# Patient Record
Sex: Male | Born: 1935 | Race: White | Hispanic: No | Smoking: Former smoker
Health system: Southern US, Community
[De-identification: ages and names within clinical notes are randomized; demographics above are authoritative.]

## PROBLEM LIST (undated history)

## (undated) DIAGNOSIS — R195 Other fecal abnormalities: Secondary | ICD-10-CM

## (undated) DIAGNOSIS — K219 Gastro-esophageal reflux disease without esophagitis: Secondary | ICD-10-CM

## (undated) DIAGNOSIS — Z85038 Personal history of other malignant neoplasm of large intestine: Secondary | ICD-10-CM

## (undated) DIAGNOSIS — I6200 Nontraumatic subdural hemorrhage, unspecified: Secondary | ICD-10-CM

## (undated) DIAGNOSIS — J9819 Other pulmonary collapse: Secondary | ICD-10-CM

## (undated) DIAGNOSIS — G4700 Insomnia, unspecified: Secondary | ICD-10-CM

## (undated) DIAGNOSIS — D490 Neoplasm of unspecified behavior of digestive system: Secondary | ICD-10-CM

## (undated) DIAGNOSIS — R071 Chest pain on breathing: Secondary | ICD-10-CM

## (undated) DIAGNOSIS — J159 Unspecified bacterial pneumonia: Secondary | ICD-10-CM

## (undated) DIAGNOSIS — K5520 Angiodysplasia of colon without hemorrhage: Secondary | ICD-10-CM

## (undated) DIAGNOSIS — I719 Aortic aneurysm of unspecified site, without rupture: Secondary | ICD-10-CM

## (undated) DIAGNOSIS — R413 Other amnesia: Secondary | ICD-10-CM

## (undated) DIAGNOSIS — J449 Chronic obstructive pulmonary disease, unspecified: Secondary | ICD-10-CM

## (undated) DIAGNOSIS — I10 Essential (primary) hypertension: Secondary | ICD-10-CM

## (undated) DIAGNOSIS — D509 Iron deficiency anemia, unspecified: Secondary | ICD-10-CM

## (undated) DIAGNOSIS — F329 Major depressive disorder, single episode, unspecified: Secondary | ICD-10-CM

## (undated) HISTORY — PX: PARTIAL COLECTOMY: SHX5273

## (undated) HISTORY — DX: Other pulmonary collapse: J98.19

## (undated) HISTORY — DX: Major depressive disorder, single episode, unspecified: F32.9

## (undated) HISTORY — DX: Unspecified bacterial pneumonia: J15.9

## (undated) HISTORY — DX: Personal history of other malignant neoplasm of large intestine: Z85.038

## (undated) HISTORY — DX: Iron deficiency anemia, unspecified: D50.9

## (undated) HISTORY — DX: Neoplasm of unspecified behavior of digestive system: D49.0

## (undated) HISTORY — PX: CHOLECYSTECTOMY: SHX55

## (undated) HISTORY — DX: Chest pain on breathing: R07.1

## (undated) HISTORY — PX: AORTIC VALVE REPLACEMENT: SHX41

## (undated) HISTORY — DX: Other fecal abnormalities: R19.5

## (undated) HISTORY — DX: Essential (primary) hypertension: I10

## (undated) HISTORY — DX: Chronic obstructive pulmonary disease, unspecified: J44.9

## (undated) HISTORY — DX: Nontraumatic subdural hemorrhage, unspecified: I62.00

## (undated) HISTORY — DX: Aortic aneurysm of unspecified site, without rupture: I71.9

## (undated) HISTORY — DX: Angiodysplasia of colon without hemorrhage: K55.20

## (undated) HISTORY — DX: Insomnia, unspecified: G47.00

## (undated) HISTORY — DX: Gastro-esophageal reflux disease without esophagitis: K21.9

## (undated) HISTORY — DX: Other amnesia: R41.3

---

## 1999-10-08 ENCOUNTER — Emergency Department (HOSPITAL_COMMUNITY): Admission: EM | Admit: 1999-10-08 | Discharge: 1999-10-08 | Payer: Self-pay | Admitting: Emergency Medicine

## 1999-10-09 ENCOUNTER — Encounter: Payer: Self-pay | Admitting: Emergency Medicine

## 2001-06-18 ENCOUNTER — Emergency Department (HOSPITAL_COMMUNITY): Admission: EM | Admit: 2001-06-18 | Discharge: 2001-06-18 | Payer: Self-pay | Admitting: Emergency Medicine

## 2001-06-18 ENCOUNTER — Encounter: Payer: Self-pay | Admitting: Emergency Medicine

## 2001-10-26 ENCOUNTER — Encounter: Payer: Self-pay | Admitting: Family Medicine

## 2001-10-26 ENCOUNTER — Ambulatory Visit (HOSPITAL_COMMUNITY): Admission: RE | Admit: 2001-10-26 | Discharge: 2001-10-26 | Payer: Self-pay | Admitting: Family Medicine

## 2005-04-16 ENCOUNTER — Inpatient Hospital Stay (HOSPITAL_COMMUNITY): Admission: EM | Admit: 2005-04-16 | Discharge: 2005-05-03 | Payer: Self-pay | Admitting: Emergency Medicine

## 2005-04-17 ENCOUNTER — Encounter (INDEPENDENT_AMBULATORY_CARE_PROVIDER_SITE_OTHER): Payer: Self-pay | Admitting: *Deleted

## 2005-04-21 ENCOUNTER — Ambulatory Visit: Payer: Self-pay | Admitting: Internal Medicine

## 2005-04-22 ENCOUNTER — Encounter (INDEPENDENT_AMBULATORY_CARE_PROVIDER_SITE_OTHER): Payer: Self-pay | Admitting: *Deleted

## 2005-04-22 ENCOUNTER — Encounter: Payer: Self-pay | Admitting: Internal Medicine

## 2005-04-25 ENCOUNTER — Encounter (INDEPENDENT_AMBULATORY_CARE_PROVIDER_SITE_OTHER): Payer: Self-pay | Admitting: Specialist

## 2005-04-25 ENCOUNTER — Encounter: Payer: Self-pay | Admitting: Internal Medicine

## 2005-05-03 ENCOUNTER — Encounter: Payer: Self-pay | Admitting: Internal Medicine

## 2005-05-05 ENCOUNTER — Ambulatory Visit: Payer: Self-pay | Admitting: Hematology & Oncology

## 2005-05-20 ENCOUNTER — Encounter: Admission: RE | Admit: 2005-05-20 | Discharge: 2005-05-20 | Payer: Self-pay | Admitting: Dentistry

## 2005-05-20 ENCOUNTER — Ambulatory Visit: Payer: Self-pay | Admitting: Dentistry

## 2005-06-26 ENCOUNTER — Ambulatory Visit (HOSPITAL_COMMUNITY): Admission: RE | Admit: 2005-06-26 | Discharge: 2005-06-26 | Payer: Self-pay | Admitting: Dentistry

## 2005-06-26 ENCOUNTER — Ambulatory Visit: Payer: Self-pay | Admitting: Dentistry

## 2005-07-07 ENCOUNTER — Ambulatory Visit (HOSPITAL_COMMUNITY): Admission: RE | Admit: 2005-07-07 | Discharge: 2005-07-07 | Payer: Self-pay | Admitting: Hematology & Oncology

## 2005-07-22 ENCOUNTER — Encounter: Admission: RE | Admit: 2005-07-22 | Discharge: 2005-07-22 | Payer: Self-pay | Admitting: Surgery

## 2005-07-25 ENCOUNTER — Ambulatory Visit: Payer: Self-pay | Admitting: Hematology & Oncology

## 2005-08-04 ENCOUNTER — Encounter (INDEPENDENT_AMBULATORY_CARE_PROVIDER_SITE_OTHER): Payer: Self-pay | Admitting: Specialist

## 2005-08-04 ENCOUNTER — Inpatient Hospital Stay (HOSPITAL_COMMUNITY): Admission: RE | Admit: 2005-08-04 | Discharge: 2005-08-13 | Payer: Self-pay | Admitting: Surgery

## 2005-08-20 ENCOUNTER — Inpatient Hospital Stay (HOSPITAL_COMMUNITY): Admission: EM | Admit: 2005-08-20 | Discharge: 2005-08-23 | Payer: Self-pay | Admitting: Emergency Medicine

## 2005-08-22 ENCOUNTER — Encounter (INDEPENDENT_AMBULATORY_CARE_PROVIDER_SITE_OTHER): Payer: Self-pay | Admitting: *Deleted

## 2005-09-16 ENCOUNTER — Encounter: Admission: RE | Admit: 2005-09-16 | Discharge: 2005-09-16 | Payer: Self-pay | Admitting: Surgery

## 2005-09-26 ENCOUNTER — Ambulatory Visit (HOSPITAL_COMMUNITY): Admission: RE | Admit: 2005-09-26 | Discharge: 2005-09-26 | Payer: Self-pay | Admitting: Hematology & Oncology

## 2005-10-03 ENCOUNTER — Ambulatory Visit: Payer: Self-pay | Admitting: Hematology & Oncology

## 2005-10-06 ENCOUNTER — Encounter: Payer: Self-pay | Admitting: Internal Medicine

## 2005-10-06 LAB — CBC WITH DIFFERENTIAL/PLATELET
Basophils Absolute: 0 10*3/uL (ref 0.0–0.1)
Eosinophils Absolute: 0.1 10*3/uL (ref 0.0–0.5)
HCT: 35.7 % — ABNORMAL LOW (ref 38.7–49.9)
HGB: 11.5 g/dL — ABNORMAL LOW (ref 13.0–17.1)
LYMPH%: 23.7 % (ref 14.0–48.0)
MCV: 83 fL (ref 81.6–98.0)
MONO#: 0.4 10*3/uL (ref 0.1–0.9)
MONO%: 6.3 % (ref 0.0–13.0)
NEUT#: 4.5 10*3/uL (ref 1.5–6.5)
Platelets: 273 10*3/uL (ref 145–400)
WBC: 6.7 10*3/uL (ref 4.0–10.0)

## 2005-10-06 LAB — COMPREHENSIVE METABOLIC PANEL
Albumin: 4 g/dL (ref 3.5–5.2)
Alkaline Phosphatase: 103 U/L (ref 39–117)
BUN: 14 mg/dL (ref 6–23)
CO2: 26 mEq/L (ref 19–32)
Glucose, Bld: 109 mg/dL — ABNORMAL HIGH (ref 70–99)
Potassium: 4.3 mEq/L (ref 3.5–5.3)
Total Bilirubin: 0.3 mg/dL (ref 0.3–1.2)
Total Protein: 6.6 g/dL (ref 6.0–8.3)

## 2005-10-06 LAB — CEA: CEA: 1 ng/mL (ref 0.0–5.0)

## 2005-12-29 ENCOUNTER — Ambulatory Visit (HOSPITAL_COMMUNITY): Admission: RE | Admit: 2005-12-29 | Discharge: 2005-12-29 | Payer: Self-pay | Admitting: Hematology & Oncology

## 2005-12-30 ENCOUNTER — Ambulatory Visit: Payer: Self-pay | Admitting: Hematology & Oncology

## 2006-01-05 LAB — CBC WITH DIFFERENTIAL/PLATELET
BASO%: 0.3 % (ref 0.0–2.0)
EOS%: 1.8 % (ref 0.0–7.0)
MCH: 24.5 pg — ABNORMAL LOW (ref 28.0–33.4)
MCHC: 32.5 g/dL (ref 32.0–35.9)
MONO#: 0.4 10*3/uL (ref 0.1–0.9)
RBC: 3.83 10*6/uL — ABNORMAL LOW (ref 4.20–5.71)
RDW: 16 % — ABNORMAL HIGH (ref 11.2–14.6)
WBC: 5.7 10*3/uL (ref 4.0–10.0)
lymph#: 1.5 10*3/uL (ref 0.9–3.3)

## 2006-01-07 LAB — COMPREHENSIVE METABOLIC PANEL
ALT: 14 U/L (ref 0–40)
AST: 15 U/L (ref 0–37)
CO2: 24 mEq/L (ref 19–32)
Calcium: 8.9 mg/dL (ref 8.4–10.5)
Chloride: 109 mEq/L (ref 96–112)
Creatinine, Ser: 1.2 mg/dL (ref 0.40–1.50)
Sodium: 141 mEq/L (ref 135–145)
Total Bilirubin: 0.3 mg/dL (ref 0.3–1.2)
Total Protein: 6.7 g/dL (ref 6.0–8.3)

## 2006-01-29 ENCOUNTER — Ambulatory Visit: Payer: Self-pay | Admitting: Internal Medicine

## 2006-02-13 ENCOUNTER — Ambulatory Visit: Payer: Self-pay | Admitting: Internal Medicine

## 2006-02-27 ENCOUNTER — Ambulatory Visit: Payer: Self-pay | Admitting: Hematology & Oncology

## 2006-03-02 LAB — CBC & DIFF AND RETIC
Basophils Absolute: 0 10*3/uL (ref 0.0–0.1)
EOS%: 1.6 % (ref 0.0–7.0)
Eosinophils Absolute: 0.1 10*3/uL (ref 0.0–0.5)
HCT: 35.3 % — ABNORMAL LOW (ref 38.7–49.9)
HGB: 11.7 g/dL — ABNORMAL LOW (ref 13.0–17.1)
IRF: 0.27 (ref 0.070–0.380)
MCH: 28.4 pg (ref 28.0–33.4)
MONO#: 0.4 10*3/uL (ref 0.1–0.9)
NEUT#: 4.1 10*3/uL (ref 1.5–6.5)
NEUT%: 66 % (ref 40.0–75.0)
RETIC #: 42 10*3/uL (ref 31.8–103.9)
lymph#: 1.6 10*3/uL (ref 0.9–3.3)

## 2006-03-02 LAB — CHCC SMEAR

## 2006-03-04 LAB — FERRITIN: Ferritin: 121 ng/mL (ref 22–322)

## 2006-03-04 LAB — TRANSFERRIN RECEPTOR, SOLUABLE: Transferrin Receptor, Soluble: 6.9 mg/L — ABNORMAL HIGH (ref 2.2–5.0)

## 2006-05-25 ENCOUNTER — Ambulatory Visit (HOSPITAL_COMMUNITY): Admission: RE | Admit: 2006-05-25 | Discharge: 2006-05-25 | Payer: Self-pay | Admitting: Hematology & Oncology

## 2006-05-28 ENCOUNTER — Ambulatory Visit: Payer: Self-pay | Admitting: Hematology & Oncology

## 2006-06-01 LAB — CBC WITH DIFFERENTIAL/PLATELET
BASO%: 0.3 % (ref 0.0–2.0)
Basophils Absolute: 0 10*3/uL (ref 0.0–0.1)
HCT: 36.5 % — ABNORMAL LOW (ref 38.7–49.9)
HGB: 12.2 g/dL — ABNORMAL LOW (ref 13.0–17.1)
MCHC: 33.4 g/dL (ref 32.0–35.9)
MONO#: 0.4 10*3/uL (ref 0.1–0.9)
NEUT#: 4.8 10*3/uL (ref 1.5–6.5)
NEUT%: 68.3 % (ref 40.0–75.0)
WBC: 7 10*3/uL (ref 4.0–10.0)
lymph#: 1.7 10*3/uL (ref 0.9–3.3)

## 2006-06-01 LAB — COMPREHENSIVE METABOLIC PANEL
AST: 16 U/L (ref 0–37)
Albumin: 3.9 g/dL (ref 3.5–5.2)
BUN: 17 mg/dL (ref 6–23)
Calcium: 8.8 mg/dL (ref 8.4–10.5)
Chloride: 106 mEq/L (ref 96–112)
Potassium: 4.2 mEq/L (ref 3.5–5.3)

## 2006-08-20 ENCOUNTER — Emergency Department (HOSPITAL_COMMUNITY): Admission: EM | Admit: 2006-08-20 | Discharge: 2006-08-20 | Payer: Self-pay | Admitting: Emergency Medicine

## 2006-09-24 ENCOUNTER — Ambulatory Visit: Payer: Self-pay | Admitting: Hematology & Oncology

## 2006-09-28 ENCOUNTER — Ambulatory Visit (HOSPITAL_COMMUNITY): Admission: RE | Admit: 2006-09-28 | Discharge: 2006-09-28 | Payer: Self-pay | Admitting: Hematology & Oncology

## 2006-09-28 LAB — COMPREHENSIVE METABOLIC PANEL
ALT: 15 U/L (ref 0–53)
AST: 22 U/L (ref 0–37)
Chloride: 106 mEq/L (ref 96–112)
Creatinine, Ser: 1.18 mg/dL (ref 0.40–1.50)
Total Bilirubin: 0.4 mg/dL (ref 0.3–1.2)

## 2006-09-28 LAB — CBC WITH DIFFERENTIAL/PLATELET
BASO%: 0.4 % (ref 0.0–2.0)
EOS%: 1.8 % (ref 0.0–7.0)
HCT: 40 % (ref 38.7–49.9)
LYMPH%: 24.8 % (ref 14.0–48.0)
MCH: 30.5 pg (ref 28.0–33.4)
MCHC: 34.3 g/dL (ref 32.0–35.9)
NEUT%: 66.6 % (ref 40.0–75.0)
RBC: 4.49 10*6/uL (ref 4.20–5.71)
lymph#: 1.9 10*3/uL (ref 0.9–3.3)

## 2006-10-13 ENCOUNTER — Ambulatory Visit (HOSPITAL_COMMUNITY): Admission: RE | Admit: 2006-10-13 | Discharge: 2006-10-13 | Payer: Self-pay | Admitting: Hematology & Oncology

## 2007-01-21 ENCOUNTER — Ambulatory Visit: Payer: Self-pay | Admitting: Hematology & Oncology

## 2007-01-25 LAB — COMPREHENSIVE METABOLIC PANEL
AST: 16 U/L (ref 0–37)
Alkaline Phosphatase: 104 U/L (ref 39–117)
BUN: 24 mg/dL — ABNORMAL HIGH (ref 6–23)
Calcium: 9.1 mg/dL (ref 8.4–10.5)
Creatinine, Ser: 1.51 mg/dL — ABNORMAL HIGH (ref 0.40–1.50)

## 2007-01-25 LAB — CBC WITH DIFFERENTIAL/PLATELET
BASO%: 0.2 % (ref 0.0–2.0)
Basophils Absolute: 0 10*3/uL (ref 0.0–0.1)
EOS%: 5.5 % (ref 0.0–7.0)
Eosinophils Absolute: 0.4 10*3/uL (ref 0.0–0.5)
HCT: 35 % — ABNORMAL LOW (ref 38.7–49.9)
HGB: 12.2 g/dL — ABNORMAL LOW (ref 13.0–17.1)
LYMPH%: 26.7 % (ref 14.0–48.0)
MCH: 31 pg (ref 28.0–33.4)
MCHC: 34.8 g/dL (ref 32.0–35.9)
MCV: 89.2 fL (ref 81.6–98.0)
MONO#: 0.4 10*3/uL (ref 0.1–0.9)
MONO%: 6.6 % (ref 0.0–13.0)
NEUT#: 3.9 10*3/uL (ref 1.5–6.5)
NEUT%: 61 % (ref 40.0–75.0)
Platelets: 217 10*3/uL (ref 145–400)
RBC: 3.93 10*6/uL — ABNORMAL LOW (ref 4.20–5.71)
RDW: 15.6 % — ABNORMAL HIGH (ref 11.2–14.6)
WBC: 6.5 10*3/uL (ref 4.0–10.0)
lymph#: 1.7 10*3/uL (ref 0.9–3.3)

## 2007-02-01 ENCOUNTER — Ambulatory Visit (HOSPITAL_COMMUNITY): Admission: RE | Admit: 2007-02-01 | Discharge: 2007-02-01 | Payer: Self-pay | Admitting: Hematology & Oncology

## 2007-08-02 ENCOUNTER — Ambulatory Visit: Payer: Self-pay | Admitting: Hematology & Oncology

## 2007-08-04 LAB — CBC WITH DIFFERENTIAL/PLATELET
BASO%: 0.1 % (ref 0.0–2.0)
HCT: 36.8 % — ABNORMAL LOW (ref 38.7–49.9)
MCHC: 33.1 g/dL (ref 32.0–35.9)
MONO#: 0.4 10*3/uL (ref 0.1–0.9)
NEUT%: 69.6 % (ref 40.0–75.0)
WBC: 7.4 10*3/uL (ref 4.0–10.0)
lymph#: 1.5 10*3/uL (ref 0.9–3.3)

## 2007-08-05 LAB — COMPREHENSIVE METABOLIC PANEL
BUN: 32 mg/dL — ABNORMAL HIGH (ref 6–23)
CO2: 22 mEq/L (ref 19–32)
Creatinine, Ser: 1.86 mg/dL — ABNORMAL HIGH (ref 0.40–1.50)
Glucose, Bld: 121 mg/dL — ABNORMAL HIGH (ref 70–99)
Total Bilirubin: 0.4 mg/dL (ref 0.3–1.2)

## 2007-08-05 LAB — CEA: CEA: 1 ng/mL (ref 0.0–5.0)

## 2008-02-04 ENCOUNTER — Encounter: Payer: Self-pay | Admitting: Family Medicine

## 2008-02-04 ENCOUNTER — Inpatient Hospital Stay (HOSPITAL_COMMUNITY): Admission: EM | Admit: 2008-02-04 | Discharge: 2008-02-22 | Payer: Self-pay | Admitting: Emergency Medicine

## 2008-02-18 ENCOUNTER — Ambulatory Visit: Payer: Self-pay | Admitting: Physical Medicine & Rehabilitation

## 2008-02-22 ENCOUNTER — Inpatient Hospital Stay (HOSPITAL_COMMUNITY)
Admission: RE | Admit: 2008-02-22 | Discharge: 2008-03-07 | Payer: Self-pay | Admitting: Physical Medicine & Rehabilitation

## 2008-03-06 ENCOUNTER — Ambulatory Visit: Payer: Self-pay | Admitting: Physical Medicine & Rehabilitation

## 2008-03-09 ENCOUNTER — Ambulatory Visit (HOSPITAL_COMMUNITY)
Admission: RE | Admit: 2008-03-09 | Discharge: 2008-03-09 | Payer: Self-pay | Admitting: Physical Medicine & Rehabilitation

## 2008-03-29 ENCOUNTER — Inpatient Hospital Stay (HOSPITAL_COMMUNITY): Admission: RE | Admit: 2008-03-29 | Discharge: 2008-04-01 | Payer: Self-pay | Admitting: Neurosurgery

## 2008-04-04 ENCOUNTER — Ambulatory Visit: Payer: Self-pay | Admitting: Hematology & Oncology

## 2008-04-05 LAB — COMPREHENSIVE METABOLIC PANEL
ALT: 10 U/L (ref 0–53)
BUN: 16 mg/dL (ref 6–23)
CO2: 23 mEq/L (ref 19–32)
Calcium: 9 mg/dL (ref 8.4–10.5)
Chloride: 108 mEq/L (ref 96–112)
Creatinine, Ser: 1.18 mg/dL (ref 0.40–1.50)
Glucose, Bld: 97 mg/dL (ref 70–99)

## 2008-04-05 LAB — CBC WITH DIFFERENTIAL (CANCER CENTER ONLY)
BASO#: 0 10*3/uL (ref 0.0–0.2)
BASO%: 0.4 % (ref 0.0–2.0)
HCT: 29.2 % — ABNORMAL LOW (ref 38.7–49.9)
HGB: 9.9 g/dL — ABNORMAL LOW (ref 13.0–17.1)
LYMPH#: 1.4 10*3/uL (ref 0.9–3.3)
MONO#: 0.3 10*3/uL (ref 0.1–0.9)
NEUT#: 3 10*3/uL (ref 1.5–6.5)
NEUT%: 61.4 % (ref 40.0–80.0)
RDW: 13.4 % (ref 10.5–14.6)
WBC: 4.9 10*3/uL (ref 4.0–10.0)

## 2008-04-13 ENCOUNTER — Ambulatory Visit: Payer: Self-pay | Admitting: Physical Medicine & Rehabilitation

## 2008-04-13 ENCOUNTER — Encounter
Admission: RE | Admit: 2008-04-13 | Discharge: 2008-04-13 | Payer: Self-pay | Admitting: Physical Medicine & Rehabilitation

## 2008-09-21 ENCOUNTER — Ambulatory Visit: Payer: Self-pay | Admitting: Family Medicine

## 2008-09-21 DIAGNOSIS — F3289 Other specified depressive episodes: Secondary | ICD-10-CM

## 2008-09-21 DIAGNOSIS — I6200 Nontraumatic subdural hemorrhage, unspecified: Secondary | ICD-10-CM

## 2008-09-21 DIAGNOSIS — K219 Gastro-esophageal reflux disease without esophagitis: Secondary | ICD-10-CM

## 2008-09-21 DIAGNOSIS — F329 Major depressive disorder, single episode, unspecified: Secondary | ICD-10-CM

## 2008-09-21 DIAGNOSIS — Z85038 Personal history of other malignant neoplasm of large intestine: Secondary | ICD-10-CM | POA: Insufficient documentation

## 2008-09-21 HISTORY — DX: Gastro-esophageal reflux disease without esophagitis: K21.9

## 2008-09-21 HISTORY — DX: Major depressive disorder, single episode, unspecified: F32.9

## 2008-09-21 HISTORY — DX: Other specified depressive episodes: F32.89

## 2008-09-21 HISTORY — DX: Nontraumatic subdural hemorrhage, unspecified: I62.00

## 2008-09-21 HISTORY — DX: Personal history of other malignant neoplasm of large intestine: Z85.038

## 2008-10-03 ENCOUNTER — Ambulatory Visit: Payer: Self-pay | Admitting: Hematology & Oncology

## 2008-10-04 LAB — CHCC SATELLITE - SMEAR

## 2008-10-04 LAB — CBC WITH DIFFERENTIAL (CANCER CENTER ONLY)
BASO%: 0.3 % (ref 0.0–2.0)
LYMPH#: 1.7 10*3/uL (ref 0.9–3.3)
MONO#: 0.4 10*3/uL (ref 0.1–0.9)
Platelets: 183 10*3/uL (ref 145–400)
RDW: 13.9 % (ref 10.5–14.6)
WBC: 6.9 10*3/uL (ref 4.0–10.0)

## 2008-10-04 LAB — COMPREHENSIVE METABOLIC PANEL
ALT: 12 U/L (ref 0–53)
AST: 20 U/L (ref 0–37)
Alkaline Phosphatase: 124 U/L — ABNORMAL HIGH (ref 39–117)
Creatinine, Ser: 1.26 mg/dL (ref 0.40–1.50)
Total Bilirubin: 0.4 mg/dL (ref 0.3–1.2)

## 2008-10-04 LAB — CEA: CEA: 0.9 ng/mL (ref 0.0–5.0)

## 2008-10-04 LAB — FERRITIN: Ferritin: 20 ng/mL — ABNORMAL LOW (ref 22–322)

## 2008-11-01 DIAGNOSIS — I10 Essential (primary) hypertension: Secondary | ICD-10-CM

## 2008-11-01 HISTORY — DX: Essential (primary) hypertension: I10

## 2008-11-10 ENCOUNTER — Encounter: Payer: Self-pay | Admitting: Family Medicine

## 2008-11-21 ENCOUNTER — Ambulatory Visit: Payer: Self-pay | Admitting: Family Medicine

## 2008-11-21 DIAGNOSIS — R071 Chest pain on breathing: Secondary | ICD-10-CM | POA: Insufficient documentation

## 2008-11-21 HISTORY — DX: Chest pain on breathing: R07.1

## 2008-11-23 ENCOUNTER — Ambulatory Visit: Payer: Self-pay | Admitting: Family Medicine

## 2008-11-23 DIAGNOSIS — J13 Pneumonia due to Streptococcus pneumoniae: Secondary | ICD-10-CM | POA: Insufficient documentation

## 2008-11-27 ENCOUNTER — Telehealth: Payer: Self-pay | Admitting: Family Medicine

## 2008-11-28 ENCOUNTER — Ambulatory Visit: Payer: Self-pay | Admitting: Hematology & Oncology

## 2008-11-29 LAB — CBC WITH DIFFERENTIAL (CANCER CENTER ONLY)
BASO%: 0.7 % (ref 0.0–2.0)
EOS%: 1.5 % (ref 0.0–7.0)
LYMPH%: 13.9 % — ABNORMAL LOW (ref 14.0–48.0)
MCH: 28.7 pg (ref 28.0–33.4)
MCV: 88 fL (ref 82–98)
MONO%: 7.3 % (ref 0.0–13.0)
Platelets: 437 10*3/uL — ABNORMAL HIGH (ref 145–400)
RDW: 13.2 % (ref 10.5–14.6)
WBC: 10.8 10*3/uL — ABNORMAL HIGH (ref 4.0–10.0)

## 2008-11-30 ENCOUNTER — Telehealth: Payer: Self-pay | Admitting: Family Medicine

## 2008-12-07 ENCOUNTER — Ambulatory Visit: Payer: Self-pay | Admitting: Family Medicine

## 2008-12-08 ENCOUNTER — Telehealth: Payer: Self-pay | Admitting: Family Medicine

## 2008-12-26 ENCOUNTER — Telehealth: Payer: Self-pay | Admitting: Internal Medicine

## 2008-12-27 ENCOUNTER — Telehealth: Payer: Self-pay | Admitting: Family Medicine

## 2009-01-08 ENCOUNTER — Ambulatory Visit: Payer: Self-pay | Admitting: Family Medicine

## 2009-01-08 DIAGNOSIS — R413 Other amnesia: Secondary | ICD-10-CM

## 2009-01-08 DIAGNOSIS — J90 Pleural effusion, not elsewhere classified: Secondary | ICD-10-CM | POA: Insufficient documentation

## 2009-01-08 HISTORY — DX: Other amnesia: R41.3

## 2009-01-09 ENCOUNTER — Ambulatory Visit: Payer: Self-pay | Admitting: Family Medicine

## 2009-01-10 ENCOUNTER — Ambulatory Visit: Payer: Self-pay | Admitting: Family Medicine

## 2009-01-10 LAB — CONVERTED CEMR LAB
BUN: 15 mg/dL (ref 6–23)
Calcium: 8.9 mg/dL (ref 8.4–10.5)
Creatinine, Ser: 1 mg/dL (ref 0.4–1.5)
GFR calc non Af Amer: 76.07 mL/min (ref >60)
Glucose, Bld: 136 mg/dL — ABNORMAL HIGH (ref 70–99)
Potassium: 4.3 meq/L (ref 3.5–5.1)
TSH: 1.06 microintl units/mL (ref 0.35–5.50)

## 2009-01-11 ENCOUNTER — Telehealth: Payer: Self-pay | Admitting: Family Medicine

## 2009-01-11 ENCOUNTER — Ambulatory Visit: Payer: Self-pay | Admitting: Cardiology

## 2009-01-22 ENCOUNTER — Ambulatory Visit: Payer: Self-pay | Admitting: Family Medicine

## 2009-01-31 ENCOUNTER — Ambulatory Visit: Payer: Self-pay | Admitting: Pulmonary Disease

## 2009-01-31 DIAGNOSIS — R930 Abnormal findings on diagnostic imaging of skull and head, not elsewhere classified: Secondary | ICD-10-CM | POA: Insufficient documentation

## 2009-02-06 ENCOUNTER — Ambulatory Visit: Admission: RE | Admit: 2009-02-06 | Discharge: 2009-02-06 | Payer: Self-pay | Admitting: Pulmonary Disease

## 2009-02-06 ENCOUNTER — Ambulatory Visit: Payer: Self-pay | Admitting: Pulmonary Disease

## 2009-02-12 ENCOUNTER — Telehealth (INDEPENDENT_AMBULATORY_CARE_PROVIDER_SITE_OTHER): Payer: Self-pay | Admitting: *Deleted

## 2009-02-28 ENCOUNTER — Ambulatory Visit: Payer: Self-pay | Admitting: Family Medicine

## 2009-03-14 ENCOUNTER — Ambulatory Visit: Payer: Self-pay | Admitting: Pulmonary Disease

## 2009-03-16 ENCOUNTER — Telehealth (INDEPENDENT_AMBULATORY_CARE_PROVIDER_SITE_OTHER): Payer: Self-pay | Admitting: *Deleted

## 2009-04-05 ENCOUNTER — Ambulatory Visit: Payer: Self-pay | Admitting: Hematology & Oncology

## 2009-04-06 LAB — COMPREHENSIVE METABOLIC PANEL
ALT: 11 U/L (ref 0–53)
AST: 19 U/L (ref 0–37)
Albumin: 4.2 g/dL (ref 3.5–5.2)
Alkaline Phosphatase: 101 U/L (ref 39–117)
Potassium: 4.1 mEq/L (ref 3.5–5.3)
Sodium: 142 mEq/L (ref 135–145)
Total Bilirubin: 0.3 mg/dL (ref 0.3–1.2)
Total Protein: 7 g/dL (ref 6.0–8.3)

## 2009-04-06 LAB — CBC WITH DIFFERENTIAL (CANCER CENTER ONLY)
BASO#: 0 10*3/uL (ref 0.0–0.2)
Eosinophils Absolute: 0.1 10*3/uL (ref 0.0–0.5)
LYMPH%: 27.3 % (ref 14.0–48.0)
MCH: 28.5 pg (ref 28.0–33.4)
MCV: 85 fL (ref 82–98)
MONO%: 5.4 % (ref 0.0–13.0)
Platelets: 192 10*3/uL (ref 145–400)
RBC: 4.36 10*6/uL (ref 4.20–5.70)

## 2009-04-06 LAB — CEA: CEA: 1.5 ng/mL (ref 0.0–5.0)

## 2009-04-23 ENCOUNTER — Telehealth: Payer: Self-pay | Admitting: Family Medicine

## 2009-04-25 ENCOUNTER — Ambulatory Visit: Payer: Self-pay | Admitting: Family Medicine

## 2009-04-26 ENCOUNTER — Telehealth (INDEPENDENT_AMBULATORY_CARE_PROVIDER_SITE_OTHER): Payer: Self-pay | Admitting: *Deleted

## 2009-05-09 ENCOUNTER — Telehealth: Payer: Self-pay | Admitting: Family Medicine

## 2009-05-23 ENCOUNTER — Ambulatory Visit: Payer: Self-pay | Admitting: Internal Medicine

## 2009-05-23 DIAGNOSIS — D509 Iron deficiency anemia, unspecified: Secondary | ICD-10-CM

## 2009-05-23 DIAGNOSIS — K5520 Angiodysplasia of colon without hemorrhage: Secondary | ICD-10-CM

## 2009-05-23 HISTORY — DX: Iron deficiency anemia, unspecified: D50.9

## 2009-05-23 HISTORY — DX: Angiodysplasia of colon without hemorrhage: K55.20

## 2009-05-29 ENCOUNTER — Ambulatory Visit: Payer: Self-pay | Admitting: Family Medicine

## 2009-05-29 DIAGNOSIS — J069 Acute upper respiratory infection, unspecified: Secondary | ICD-10-CM | POA: Insufficient documentation

## 2009-06-11 ENCOUNTER — Ambulatory Visit: Payer: Self-pay | Admitting: Pulmonary Disease

## 2009-06-11 DIAGNOSIS — J9819 Other pulmonary collapse: Secondary | ICD-10-CM

## 2009-06-11 HISTORY — DX: Other pulmonary collapse: J98.19

## 2009-07-30 ENCOUNTER — Telehealth: Payer: Self-pay | Admitting: Family Medicine

## 2009-08-23 ENCOUNTER — Ambulatory Visit: Payer: Self-pay | Admitting: Family Medicine

## 2009-10-09 ENCOUNTER — Encounter: Payer: Self-pay | Admitting: Family Medicine

## 2009-10-09 ENCOUNTER — Ambulatory Visit: Payer: Self-pay | Admitting: Hematology & Oncology

## 2009-10-09 LAB — CBC WITH DIFFERENTIAL (CANCER CENTER ONLY)
BASO%: 0.4 % (ref 0.0–2.0)
Eosinophils Absolute: 0.2 10*3/uL (ref 0.0–0.5)
HCT: 30.5 % — ABNORMAL LOW (ref 38.7–49.9)
LYMPH#: 1.5 10*3/uL (ref 0.9–3.3)
MONO#: 0.4 10*3/uL (ref 0.1–0.9)
NEUT%: 64.5 % (ref 40.0–80.0)
RBC: 3.6 10*6/uL — ABNORMAL LOW (ref 4.20–5.70)
RDW: 12.6 % (ref 10.5–14.6)
WBC: 5.9 10*3/uL (ref 4.0–10.0)

## 2009-10-09 LAB — COMPREHENSIVE METABOLIC PANEL
ALT: 14 U/L (ref 0–53)
CO2: 21 mEq/L (ref 19–32)
Calcium: 8 mg/dL — ABNORMAL LOW (ref 8.4–10.5)
Chloride: 111 mEq/L (ref 96–112)
Creatinine, Ser: 1.08 mg/dL (ref 0.40–1.50)
Sodium: 143 mEq/L (ref 135–145)
Total Protein: 5.8 g/dL — ABNORMAL LOW (ref 6.0–8.3)

## 2009-10-09 LAB — CEA: CEA: 2.1 ng/mL (ref 0.0–5.0)

## 2009-10-18 ENCOUNTER — Telehealth: Payer: Self-pay | Admitting: Internal Medicine

## 2009-10-19 ENCOUNTER — Telehealth: Payer: Self-pay | Admitting: Family Medicine

## 2009-10-22 ENCOUNTER — Telehealth: Payer: Self-pay | Admitting: Internal Medicine

## 2009-10-25 ENCOUNTER — Ambulatory Visit: Payer: Self-pay | Admitting: Family Medicine

## 2009-10-29 LAB — CBC WITH DIFFERENTIAL (CANCER CENTER ONLY)
BASO%: 0.3 % (ref 0.0–2.0)
LYMPH#: 1.5 10*3/uL (ref 0.9–3.3)
LYMPH%: 26.9 % (ref 14.0–48.0)
MCV: 90 fL (ref 82–98)
MONO#: 0.3 10*3/uL (ref 0.1–0.9)
NEUT#: 3.6 10*3/uL (ref 1.5–6.5)
Platelets: 213 10*3/uL (ref 145–400)
RBC: 4.11 10*6/uL — ABNORMAL LOW (ref 4.20–5.70)
RDW: 15 % — ABNORMAL HIGH (ref 10.5–14.6)
WBC: 5.7 10*3/uL (ref 4.0–10.0)

## 2009-11-08 ENCOUNTER — Ambulatory Visit: Payer: Self-pay | Admitting: Internal Medicine

## 2009-11-08 DIAGNOSIS — R195 Other fecal abnormalities: Secondary | ICD-10-CM

## 2009-11-08 HISTORY — DX: Other fecal abnormalities: R19.5

## 2009-11-09 ENCOUNTER — Ambulatory Visit: Payer: Self-pay | Admitting: Internal Medicine

## 2009-11-09 DIAGNOSIS — D490 Neoplasm of unspecified behavior of digestive system: Secondary | ICD-10-CM

## 2009-11-09 HISTORY — DX: Neoplasm of unspecified behavior of digestive system: D49.0

## 2009-11-09 LAB — CONVERTED CEMR LAB
CO2: 25 meq/L (ref 19–32)
Calcium: 8.3 mg/dL — ABNORMAL LOW (ref 8.4–10.5)
Creatinine, Ser: 1.1 mg/dL (ref 0.4–1.5)
GFR calc non Af Amer: 65.93 mL/min (ref >60)
Sodium: 144 meq/L (ref 135–145)

## 2009-11-12 ENCOUNTER — Telehealth: Payer: Self-pay | Admitting: Family Medicine

## 2009-11-12 ENCOUNTER — Encounter: Payer: Self-pay | Admitting: Internal Medicine

## 2009-11-14 ENCOUNTER — Telehealth (INDEPENDENT_AMBULATORY_CARE_PROVIDER_SITE_OTHER): Payer: Self-pay | Admitting: *Deleted

## 2009-11-14 ENCOUNTER — Ambulatory Visit: Payer: Self-pay | Admitting: Internal Medicine

## 2009-11-15 ENCOUNTER — Telehealth (INDEPENDENT_AMBULATORY_CARE_PROVIDER_SITE_OTHER): Payer: Self-pay | Admitting: *Deleted

## 2009-11-16 ENCOUNTER — Telehealth: Payer: Self-pay | Admitting: Family Medicine

## 2009-11-21 ENCOUNTER — Encounter: Payer: Self-pay | Admitting: Family Medicine

## 2009-11-22 ENCOUNTER — Ambulatory Visit: Payer: Self-pay | Admitting: Hematology & Oncology

## 2009-11-22 ENCOUNTER — Encounter: Payer: Self-pay | Admitting: Internal Medicine

## 2009-11-22 LAB — CBC WITH DIFFERENTIAL (CANCER CENTER ONLY)
BASO%: 0.5 % (ref 0.0–2.0)
EOS%: 3.1 % (ref 0.0–7.0)
LYMPH#: 1.6 10*3/uL (ref 0.9–3.3)
MCHC: 33.3 g/dL (ref 32.0–35.9)
MONO#: 0.4 10*3/uL (ref 0.1–0.9)
NEUT#: 5.3 10*3/uL (ref 1.5–6.5)
RDW: 14.5 % (ref 10.5–14.6)
WBC: 7.5 10*3/uL (ref 4.0–10.0)

## 2009-11-22 LAB — FERRITIN: Ferritin: 34 ng/mL (ref 22–322)

## 2009-11-22 LAB — CEA: CEA: 2.8 ng/mL (ref 0.0–5.0)

## 2009-11-29 ENCOUNTER — Ambulatory Visit: Payer: Self-pay | Admitting: Diagnostic Radiology

## 2009-11-29 ENCOUNTER — Ambulatory Visit (HOSPITAL_BASED_OUTPATIENT_CLINIC_OR_DEPARTMENT_OTHER): Admission: RE | Admit: 2009-11-29 | Discharge: 2009-11-29 | Payer: Self-pay | Admitting: Hematology & Oncology

## 2009-12-10 ENCOUNTER — Ambulatory Visit: Payer: Self-pay | Admitting: Pulmonary Disease

## 2010-01-17 ENCOUNTER — Inpatient Hospital Stay (HOSPITAL_COMMUNITY): Admission: RE | Admit: 2010-01-17 | Discharge: 2010-01-30 | Payer: Self-pay | Admitting: General Surgery

## 2010-01-17 ENCOUNTER — Encounter (INDEPENDENT_AMBULATORY_CARE_PROVIDER_SITE_OTHER): Payer: Self-pay | Admitting: General Surgery

## 2010-01-17 ENCOUNTER — Ambulatory Visit: Payer: Self-pay | Admitting: Pulmonary Disease

## 2010-01-24 ENCOUNTER — Encounter (INDEPENDENT_AMBULATORY_CARE_PROVIDER_SITE_OTHER): Payer: Self-pay | Admitting: Pulmonary Disease

## 2010-01-30 ENCOUNTER — Telehealth: Payer: Self-pay | Admitting: Pulmonary Disease

## 2010-02-01 ENCOUNTER — Ambulatory Visit: Payer: Self-pay | Admitting: Family Medicine

## 2010-02-01 DIAGNOSIS — J4489 Other specified chronic obstructive pulmonary disease: Secondary | ICD-10-CM

## 2010-02-01 DIAGNOSIS — J449 Chronic obstructive pulmonary disease, unspecified: Secondary | ICD-10-CM

## 2010-02-01 HISTORY — DX: Other specified chronic obstructive pulmonary disease: J44.89

## 2010-02-01 HISTORY — DX: Chronic obstructive pulmonary disease, unspecified: J44.9

## 2010-02-04 ENCOUNTER — Telehealth: Payer: Self-pay | Admitting: Family Medicine

## 2010-02-06 ENCOUNTER — Ambulatory Visit: Payer: Self-pay | Admitting: Hematology & Oncology

## 2010-02-06 ENCOUNTER — Telehealth: Payer: Self-pay | Admitting: Family Medicine

## 2010-02-07 ENCOUNTER — Telehealth: Payer: Self-pay | Admitting: Family Medicine

## 2010-02-07 ENCOUNTER — Ambulatory Visit (HOSPITAL_BASED_OUTPATIENT_CLINIC_OR_DEPARTMENT_OTHER): Admission: RE | Admit: 2010-02-07 | Discharge: 2010-02-07 | Payer: Self-pay | Admitting: Hematology & Oncology

## 2010-02-07 ENCOUNTER — Ambulatory Visit: Payer: Self-pay | Admitting: Diagnostic Radiology

## 2010-02-07 ENCOUNTER — Encounter: Payer: Self-pay | Admitting: Internal Medicine

## 2010-02-07 LAB — CBC WITH DIFFERENTIAL (CANCER CENTER ONLY)
BASO#: 0.2 10*3/uL (ref 0.0–0.2)
Eosinophils Absolute: 0.4 10*3/uL (ref 0.0–0.5)
HGB: 10.9 g/dL — ABNORMAL LOW (ref 13.0–17.1)
LYMPH%: 17.5 % (ref 14.0–48.0)
MCH: 26.6 pg — ABNORMAL LOW (ref 28.0–33.4)
MCV: 82 fL (ref 82–98)
MONO#: 0.7 10*3/uL (ref 0.1–0.9)
MONO%: 4.9 % (ref 0.0–13.0)
Platelets: 227 10*3/uL (ref 145–400)
RBC: 4.08 10*6/uL — ABNORMAL LOW (ref 4.20–5.70)
WBC: 14.6 10*3/uL — ABNORMAL HIGH (ref 4.0–10.0)

## 2010-02-08 LAB — COMPREHENSIVE METABOLIC PANEL
Albumin: 2.6 g/dL — ABNORMAL LOW (ref 3.5–5.2)
BUN: 29 mg/dL — ABNORMAL HIGH (ref 6–23)
CO2: 21 mEq/L (ref 19–32)
Glucose, Bld: 189 mg/dL — ABNORMAL HIGH (ref 70–99)
Potassium: 4.3 mEq/L (ref 3.5–5.3)
Sodium: 139 mEq/L (ref 135–145)
Total Bilirubin: 0.3 mg/dL (ref 0.3–1.2)
Total Protein: 4.9 g/dL — ABNORMAL LOW (ref 6.0–8.3)

## 2010-02-08 LAB — FERRITIN: Ferritin: 128 ng/mL (ref 22–322)

## 2010-02-08 LAB — CEA: CEA: 1.8 ng/mL (ref 0.0–5.0)

## 2010-02-12 ENCOUNTER — Encounter: Payer: Self-pay | Admitting: Internal Medicine

## 2010-02-14 ENCOUNTER — Ambulatory Visit: Payer: Self-pay | Admitting: Pulmonary Disease

## 2010-02-14 DIAGNOSIS — J159 Unspecified bacterial pneumonia: Secondary | ICD-10-CM | POA: Insufficient documentation

## 2010-02-14 HISTORY — DX: Unspecified bacterial pneumonia: J15.9

## 2010-02-25 ENCOUNTER — Telehealth: Payer: Self-pay | Admitting: Internal Medicine

## 2010-03-04 ENCOUNTER — Ambulatory Visit: Payer: Self-pay | Admitting: Family Medicine

## 2010-03-12 ENCOUNTER — Ambulatory Visit: Payer: Self-pay | Admitting: Hematology & Oncology

## 2010-03-13 ENCOUNTER — Ambulatory Visit: Payer: Self-pay | Admitting: Pulmonary Disease

## 2010-03-14 ENCOUNTER — Encounter: Payer: Self-pay | Admitting: Internal Medicine

## 2010-03-14 LAB — CBC WITH DIFFERENTIAL (CANCER CENTER ONLY)
BASO#: 0.1 10*3/uL (ref 0.0–0.2)
Eosinophils Absolute: 0.2 10*3/uL (ref 0.0–0.5)
HGB: 11.2 g/dL — ABNORMAL LOW (ref 13.0–17.1)
LYMPH#: 2 10*3/uL (ref 0.9–3.3)
MONO#: 0.5 10*3/uL (ref 0.1–0.9)
MONO%: 5.5 % (ref 0.0–13.0)
NEUT#: 6.6 10*3/uL — ABNORMAL HIGH (ref 1.5–6.5)
Platelets: 292 10*3/uL (ref 145–400)
RBC: 4.51 10*6/uL (ref 4.20–5.70)
WBC: 9.4 10*3/uL (ref 4.0–10.0)

## 2010-03-22 ENCOUNTER — Telehealth: Payer: Self-pay | Admitting: Family Medicine

## 2010-04-02 ENCOUNTER — Ambulatory Visit: Payer: Self-pay | Admitting: Pulmonary Disease

## 2010-04-04 ENCOUNTER — Telehealth (INDEPENDENT_AMBULATORY_CARE_PROVIDER_SITE_OTHER): Payer: Self-pay | Admitting: *Deleted

## 2010-04-18 ENCOUNTER — Ambulatory Visit: Payer: Self-pay | Admitting: Family Medicine

## 2010-04-23 ENCOUNTER — Ambulatory Visit: Payer: Self-pay | Admitting: Family Medicine

## 2010-04-23 DIAGNOSIS — G4700 Insomnia, unspecified: Secondary | ICD-10-CM

## 2010-04-23 HISTORY — DX: Insomnia, unspecified: G47.00

## 2010-05-02 ENCOUNTER — Telehealth (INDEPENDENT_AMBULATORY_CARE_PROVIDER_SITE_OTHER): Payer: Self-pay | Admitting: *Deleted

## 2010-05-22 ENCOUNTER — Ambulatory Visit: Payer: Self-pay | Admitting: Hematology & Oncology

## 2010-05-27 ENCOUNTER — Encounter: Payer: Self-pay | Admitting: Family Medicine

## 2010-05-27 LAB — COMPREHENSIVE METABOLIC PANEL
ALT: 14 U/L (ref 0–53)
Alkaline Phosphatase: 94 U/L (ref 39–117)
Glucose, Bld: 102 mg/dL — ABNORMAL HIGH (ref 70–99)
Sodium: 141 mEq/L (ref 135–145)
Total Bilirubin: 0.3 mg/dL (ref 0.3–1.2)
Total Protein: 7.2 g/dL (ref 6.0–8.3)

## 2010-05-27 LAB — CBC WITH DIFFERENTIAL (CANCER CENTER ONLY)
BASO%: 0.6 % (ref 0.0–2.0)
LYMPH%: 20.4 % (ref 14.0–48.0)
MCH: 27.8 pg — ABNORMAL LOW (ref 28.0–33.4)
MCV: 86 fL (ref 82–98)
MONO%: 7.3 % (ref 0.0–13.0)
Platelets: 216 10*3/uL (ref 145–400)
RDW: 14.4 % (ref 10.5–14.6)

## 2010-05-27 LAB — FERRITIN: Ferritin: 82 ng/mL (ref 22–322)

## 2010-05-31 ENCOUNTER — Ambulatory Visit: Payer: Self-pay | Admitting: Family Medicine

## 2010-07-14 ENCOUNTER — Encounter: Payer: Self-pay | Admitting: Surgery

## 2010-07-15 ENCOUNTER — Encounter: Payer: Self-pay | Admitting: Physical Medicine & Rehabilitation

## 2010-07-23 NOTE — Miscellaneous (Signed)
  Clinical Lists Changes  Orders: Added new Test order of TLB-BMP (Basic Metabolic Panel-BMET) (80048-METABOL) - Signed 

## 2010-07-23 NOTE — Assessment & Plan Note (Signed)
Summary: 4 day rov/njr   Vital Signs:  Patient profile:   77 year old male Weight:      181 pounds Temp:     98.0 degrees F oral Pulse rate:   80 / minute Pulse rhythm:   regular Resp:     12 per minute BP sitting:   120 / 68  (left arm) Cuff size:   regular  Vitals Entered By: Sid Falcon LPN (April 23, 2010 11:04 AM)  History of Present Illness: Patient here to followup respiratory illness. Still has some cough but no fever and feels somewhat improved overall. No dyspnea at rest. On Avelox.  Family confused with medication regimen and they actually stopped his Spiriva and continued Symbicort when this should have been reversed.  Other issue is progressive dementia. Sister is caregiver and he is starting to wander more at night. He frequently takes daytime naps. Goes to bed around 7:30 PM  and frequently wakes about 10:00 PM and up throughout the night. She is becoming more exhausted with this.  He is not complaining of any pain or discomfort.  he has been on Aricept for some time for his cognitive impairment.  Allergies: 1)  ! 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) PLEURAL EFFUSION, LEFT (ICD-511.9) with LL Atx...........................Marland KitchenClance      - FOB 02/06/09 no abnormalities,  routine cultures neg PNEUMONIA, COMMUNITY ACQUIRED, PNEUMOCOCCAL (ICD-481) CHEST PAIN, PLEURITIC (ICD-786.52) HYPERTENSION (ICD-401.9) SUBDURAL HEMATOMA (ICD-432.1) ADENOCARCINOMA, COLON, HX OF (ICD-V10.05) DEPRESSION (ICD-311) GERD (ICD-530.81)    Past Surgical History: Last updated: 11/21/2008 Lower colon cancer removal 2006 Ascending Aorta Aneurysm 2007 Ascending Aortic valve replacement 2007 Upper heart chamber hole closure 2007  (PFO) Subdural Hematoma  2009 Cholecystectomy  2006  Family History: Last updated: 01/31/2009 heart disease: father cancer: father (unsure what kind), brother (prostate)   Social History: Last updated: 01/31/2009 pt is widowed. pt has children. pt is retired.  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 pt started at age 50.  1 ppd. quit 1985.  Risk Factors: Smoking Status: quit (01/31/2009) PMH-FH-SH reviewed for relevance  Review of Systems       The patient complains of decreased hearing and dyspnea on exertion.  The patient denies anorexia, fever, weight loss, chest pain, syncope, peripheral edema, headaches, hemoptysis, and abdominal pain.    Physical Exam  General:  patient is alert and cooperative. Hard of hearing. Ears:  External ear exam shows no significant lesions or deformities.  Otoscopic examination reveals clear canals, tympanic membranes are intact bilaterally without bulging, retraction, inflammation or discharge. Hearing is grossly normal bilaterally. Neck:  No deformities, masses, or tenderness noted. Lungs:  decreased aeration both bases left greater than right. No wheezes. No retractions. Heart:  normal rate and regular rhythm.   Extremities:  no edema   Impression & Recommendations:  Problem # 1:  COPD (ICD-496) recent exacerbation improved.  FAmily instructed to get him back on Spiriva. His updated medication list for this problem includes:    Albuterol Sulfate (2.5 Mg/65ml) 0.083% Nebu (Albuterol sulfate) ..... Every 6 hours as needed    Symbicort 160-4.5 Mcg/act Aero (Budesonide-formoterol fumarate) .[name]... 2 puffs two times a day    Spiriva Handihaler 18 Mcg Caps (Tiotropium bromide monohydrate) ..... One puff daily  Problem # 2:  INSOMNIA (ICD-780.52)  long discussion with family.  Avoid daytime naps.  Later to bed.  Low dose lorazepam at bedtime and discussed possible side effects.  Orders: Prescription Created Electronically  [id number])  Complete Medication List: 1)  Omeprazole 20 Mg Cpdr (Omeprazole) .... Once daily 2)  Sertraline Hcl 100 Mg Tabs (Sertraline hcl) .... One by mouth daily 3)  Centrum Silver Ultra Mens Tabs (Multiple vitamins-minerals) .... Once daily 4)  Albuterol Sulfate (2.5 Mg/55ml) 0.083% Nebu (Albuterol sulfate) .... Every 6 hours as needed 5)  Ferrous Sulfate 325 (65 Fe) Mg Tabs (Ferrous sulfate) .... Bid 6)  Aspirin 81 Mg Tbec (Aspirin) .... Take 1 tablet by mouth once a day 7)  Symbicort 160-4.5 Mcg/act Aero (Budesonide-formoterol fumarate) .... 2 puffs two times a day 8)  Spiriva Handihaler 18 Mcg Caps (Tiotropium bromide monohydrate) .... One puff daily 9)  Donepezil Hcl 10 Mg Tabs (Donepezil hcl) .... One by mouth daily 10)  Avelox 400 Mg Tabs (Moxifloxacin hcl) .... One by mouth once daily for 10 days 11)  Lorazepam 0.5 Mg Tabs (Lorazepam) .... One by mouth at bedtime  Patient Instructions: 1)  Please schedule a follow-up appointment in 1 month.  2)  Go back on daily use of Spiriva and hold Symbicort. 3)  Decrease daytime naps 4)  Later to bed at night. Prescriptions: LORAZEPAM 0.5 MG TABS (LORAZEPAM) one by mouth at bedtime  #30 x 1   Entered and Authorized by:   Evelena Peat MD   Signed by:   Evelena Peat MD on 04/23/2010   Method used:   Print then Give to Patient   RxID:   [id number]    Orders Added: 1)  Est. Patient Level IV [95621] 2)  Prescription Created Electronically [id number]

## 2010-07-23 NOTE — Progress Notes (Signed)
Summary: On Call- Spiriva refill x 1  Phone Note Call from Patient   Summary of Call: On Call- 02/23/10- Out of Spiriva.  Plan- Spiriva, # 30, No ref, keep appt. [facility] [phone] Initial call taken by: Waymon Budge MD,  February 25, 2010 8:04 PM

## 2010-07-23 NOTE — Progress Notes (Signed)
Summary: Triage / ? colonoscopy   Phone Note Call from Patient   Caller: Karrie Meres [phone] Call For: Dr. Marina Goodell Reason for Call: Talk to Nurse Summary of Call: pt has an appt. on 11-08-09 and said  Dr. Irish Lack office is requesting a sooner appt. due to a active bleed. Initial call taken by: Karna Christmas,  Oct 22, 2009 1:29 PM  Follow-up for Phone Call        Patient's son called about getting the appointment moved up earlier due to rectal bleeding anemia and hx colon CA.  I reviewed the phone notes from 10/19/09 .  Elnita Maxwell has been in contact with Dr Gustavo Lah office.  I have notified the son that I will have Elnita Maxwell review and contact him about what Dr Lamar Sprinkles recommendations were.  I have notified the son I will have Elnita Maxwell call back when she returns to the office hopefully tomorrow Follow-up by: Darcey Nora RN, CGRN,  Oct 22, 2009 3:20 PM  Additional Follow-up for Phone Call Additional follow up Details #1::         Alvino Chapel at [facility] office contacted about Dr.Perry's recommendations for pt. as reviewed with Jo-elln.She is asking if colon could be done vs. waiting until Dr.Perry is back in the office. Additional Follow-up by: Teryl Lucy RN,  Oct 24, 2009 8:32 AM    Additional Follow-up for Phone Call Additional follow up Details #2::    I think that the patient's anemia is due to known colonic AVM's. For rising CEA I recommend they order a CT scan of the abd/ pelvis to r/o mets. If CT is negative, keep pt on oral iron bid and I will see him in office. Follow-up by: Hilarie Fredrickson MD,  Oct 24, 2009 10:14 AM  Additional Follow-up for Phone Call Additional follow up Details #3:: Details for Additional Follow-up Action Taken: Alvino Chapel at [facility] office ntfd. of Dr.Perry's. orders as well as the pt's son. Additional Follow-up by: Teryl Lucy RN,  Oct 24, 2009 10:25 AM

## 2010-07-23 NOTE — Procedures (Signed)
Summary: EGD   EGD  Procedure date:  02/13/2006  Findings:      Location: [facility]  Barrett's Findings: Esophagitis  Hiatal Hernia GERD Patient Name: Gerald Dyer, Gerald Dyer. MRN: [id number] Procedure Procedures: Panendoscopy (EGD) CPT: 43235.  Personnel: Endoscopist: Wilhemina Bonito. Marina Goodell, MD.  Referred By: Pamella Pert, MD.  Exam Location: Exam performed in Outpatient Clinic. Outpatient  Patient Consent: Procedure, Alternatives, Risks and Benefits discussed, consent obtained, from patient. Consent was obtained by the RN.  Indications  Evaluation of: Anemia,  Microcytic.  History  Current Medications: Patient is not currently taking Coumadin.  Pre-Exam Physical: Performed Apr 22, 2005  Cardio-pulmonary exam, HEENT exam, Abdominal exam, Mental status exam WNL.  Comments: Pt. history reviewed/updated, physical exam performed prior to initiation of sedation? Exam Exam Info: Maximum depth of insertion Duodenum, intended Duodenum. Patient position: on left side. Vocal cords visualized. Gastric retroflexion performed. Images taken. ASA Classification: III. Tolerance: excellent.  Sedation Meds: Patient assessed and found to be appropriate for moderate (conscious) sedation. Fentanyl 25 mcg. given IV. Versed 3 mg. given IV. Cetacaine Spray 2 sprays given aerosolized.  Monitoring: BP and pulse monitoring done. Oximetry used. Supplemental O2 given  Findings - BARRETT'S ESOPHAGUS: Proximal margin 34 cm from mouth,  distal margin 38 cm. Length of Barrett's 4 cm. Inflammation present. A retroflex image was taken. ICD9: Barrett's: 530.2. Comment: No biopsies due to age / comorbidities. esophagitis present in distal squamous mucosa.  HIATAL HERNIA:  - MUCOSAL ABNORMALITY: Fundus to Body. Red spots present.  - Normal: Body to Duodenal 2nd Portion. Comments: no avm's.   Assessment  Diagnoses: 530.2: Barrett's.  530.11: Esophagitis, Reflux.  553.3: Hernia,  Hiatal.  530.81: GERD.   Events  Unplanned Intervention: No unplanned interventions were required.  Unplanned Events: There were no complications. Plans Medication(s): PPI: Omeprazole/Prilosec 20 mg QD, for indefinitely.   Comments: Iron therapy and monitorring of blood counts with Dr. Myna Hidalgo Disposition: After procedure patient sent to recovery. After recovery patient sent home.  Scheduling: Follow-up prn.   This report was created from the original endoscopy report, which was reviewed and signed by the above listed endoscopist.   cc:  Pamella Pert, MD      Arlan Organ, MD      Evelena Peat, MD      The Patient

## 2010-07-23 NOTE — Letter (Signed)
[facility]   Imported By: Lennie Odor 02/20/2010 15:05:51  _____________________________________________________________________  External Attachment:    Type:   Image     Comment:   External Document

## 2010-07-23 NOTE — Assessment & Plan Note (Signed)
Summary: post hospital followup for pna, copd   Copy to:  Evelena Peat Primary Provider/Referring Provider:  Evelena Peat MD  CC:  2 wk post hospital follow up.  Pt states breathingis better since discharge but "it's been a rough ride" per pt's son.  Pt still having "labored" breathing with activity.  Pt states he does have a prod cough with gray mucus.  Denies wheezing and chest tightness.  Was given prednsione taper by oncologist for SOB since discharge.  Pt finished this yesterday.[name]  History of Present Illness: the pt comes in today for post hospital f/u.  He was recently in [place] for right hemicolectomy, and his stay  was complicated by severe right lung pna (aspiration?) with acute respiratory failure.  The pt also has presumed "copd".  He was treated with IV abx, and comes in today for f/u.  Since d/c, he has had a f/u cxr which showed significant clearing of his right lung pna.  He has also seen oncology, and was having issues with worsening sob....was treated with a steroid taper that he is just finishing up.  Currently, he denies significant dyspnea, but remains extremely weak.  He has mild cough with nonpurulent mucus.  Current Medications (verified): 1)  Omeprazole 20 Mg Cpdr (Omeprazole) .... Once Daily 2)  Sertraline Hcl 100 Mg Tabs (Sertraline Hcl) .... One By Mouth Daily 3)  Centrum Silver Ultra Mens  Tabs (Multiple Vitamins-Minerals) .... Once Daily 4)  Aricept 10 Mg Tabs (Donepezil Hcl) .... One By Mouth Once Daily 5)  Albuterol Sulfate (2.5 Mg/20ml) 0.083% Nebu (Albuterol Sulfate) .... Every 6 Hours 6)  Ferrous Sulfate 325 (65 Fe) Mg Tabs (Ferrous Sulfate) .... Bid 7)  Aspirin 81 Mg Tbec (Aspirin) .... Take 1 Tablet By Mouth Once A Day 8)  Flovent Diskus 100 Mcg/blist Aepb (Fluticasone Propionate (Inhal)) .... Inhale 1 Puff Two Times A Day  Allergies (verified): 1)  ! Codeine Sulfate (Codeine Sulfate)  Past History:  Past medical, surgical, family and social histories  (including risk factors) reviewed, and no changes noted (except as noted below).  Past Medical History: Reviewed history from 01/25/2010 and no changes required. Anemia Osteoarthritis, kneew Cancer lower colon 2006 Stroke Subdural hematoma 2009 (right) COPD Previous Smoker PFO Hx CVA MEMORY LOSS (ICD-780.93) PLEURAL EFFUSION, LEFT (ICD-511.9) with LL Atx...........................Marland KitchenClance      - FOB 02/06/09 no abnormalities,  routine cultures neg PNEUMONIA, COMMUNITY ACQUIRED, PNEUMOCOCCAL (ICD-481) CHEST PAIN, PLEURITIC (ICD-786.52) HYPERTENSION (ICD-401.9) SUBDURAL HEMATOMA (ICD-432.1) ADENOCARCINOMA, COLON, HX OF (ICD-V10.05) DEPRESSION (ICD-311) GERD (ICD-530.81)    Past Surgical History: Reviewed history from 11/21/2008 and no changes required. Lower colon cancer removal 2006 Ascending Aorta Aneurysm 2007 Ascending Aortic valve replacement 2007 Upper heart chamber hole closure 2007  (PFO) Subdural Hematoma 2009 Cholecystectomy  2006  Family History: Reviewed history from 01/31/2009 and no changes required. heart disease: father cancer: father (unsure what kind), brother (prostate)   Social History: Reviewed history from 01/31/2009 and no changes required. pt is widowed. pt has children. pt is retired.  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 pt started at age 46.  1 ppd. quit 1985.  Review of Systems       The patient complains of shortness of breath with activity, productive cough, loss of appetite, and joint stiffness or pain.  The patient denies shortness of breath at rest, non-productive cough, coughing up blood, chest pain, irregular heartbeats, acid heartburn, indigestion, weight change, abdominal pain, difficulty swallowing, sore  throat, tooth/dental problems, headaches, nasal congestion/difficulty breathing through nose, sneezing, itching, ear ache, anxiety, depression, hand/feet swelling, rash, change in color of  mucus, and fever.    Vital Signs:  Patient profile:   77 year old male Height:      72 inches Weight:      163 pounds BMI:     22.19 O2 Sat:      96 % on Room air Temp:     97.7 degrees F oral Pulse rate:   107 / minute BP sitting:   120 / 60  (left arm) Cuff size:   regular  Vitals Entered By: Gweneth Dimitri RN (February 14, 2010 2:52 PM)  O2 Flow:  Room air CC: 2 wk post hospital follow up.  Pt states breathingis better since discharge but "it's been a rough ride" per pt's son.  Pt still having "labored" breathing with activity.  Pt states he does have a prod cough with gray mucus.  Denies wheezing and chest tightness.  Was given prednsione taper by oncologist for SOB since discharge.  Pt finished this yesterday. Comments Medications reviewed with patient Daytime contact number verified with patient. Gweneth Dimitri RN  February 14, 2010 2:53 PM    Physical Exam  General:  thin male in nad Nose:  no purulence or drainage noted. Lungs:  a few scattered crackles, but surprisingly fairly clear no wheezing or rhonchi Heart:  rrr, no mrg Extremities:  no edema noted, no cyanosis Neurologic:  alert and oriented, moves all 4.   Impression & Recommendations:  Problem # 1:  UNSPECIFIED BACTERIAL PNEUMONIA (ICD-482.9) the pt had a recent severe right lung pna that was likely due to aspiration.  He has completed his abx, and a followup cxr a few weeks ago showed significant clearing of infiltrates.  His main issue now is weakness/debility after a tough hospitalization.  Problem # 2:  COPD (ICD-496) the pt has emphysema on his recent ct chest, but has never had pfts for verification.  He has had worsening sob since d/c, but improved with a course of prednisone by oncology.  I suspect he does have emphysema, and would like to treat him with a more aggressive bronchodilator regimen.  Once he is stronger, will check spirometry.  Medications Added to Medication List This Visit: 1)  Omeprazole  20 Mg Cpdr (Omeprazole) .... Once daily 2)  Albuterol Sulfate (2.5 Mg/37ml) 0.083% Nebu (Albuterol sulfate) .... Every 6 hours 3)  Aspirin 81 Mg Tbec (Aspirin) .... Take 1 tablet by mouth once a day 4)  Flovent Diskus 100 Mcg/blist Aepb (Fluticasone propionate (inhal)) .... Inhale 1 puff two times a day  Other Orders: Est. Patient Level IV (99214) T-2 View CXR (71020TC)  Patient Instructions: 1)  stop flovent. 2)  start symbicort 160/4.5  2 inhalations in am and pm....rinse mouth well 3)  spiriva one inhalation each am 4)  can use albuterol for rescue only if needed...up to every 6hrs. 5)  will check cxr today, and call you with results 6)  will see you back in 4 weeks.

## 2010-07-23 NOTE — Assessment & Plan Note (Signed)
Summary: decreased hgb / increased CEA / heme positive stool    History of Present Illness Visit Type: Follow-up Visit Primary GI MD: Yancey Flemings MD Primary Provider: Evelena Peat MD Requesting Provider: Arlan Organ M.D. Chief Complaint: Decreased HGH, hem + stool, rising CEA History of Present Illness:   77 year old white male with multiple significant medical problems including history of stroke, descending aortic aneurysm status post surgical repair, hypertension, COPD, and history of colon cancer status post rectosigmoid colectomy in 2006. He is sent today by his oncology office regarding iron deficiency anemia, Hemoccult-positive stool, and rising CEA level. The patient is accompanied by his son who participates in the encounter. He was last seen in the office December 2010 regarding possible surveillance colonoscopy. At that time, he was frail and recovering from pneumonia. All parties involved decided against surveillance colonoscopy. Since that time he is gaining strength and has had improved appetite with significant weight gain (20 pounds). Review of outside records from the oncology office from April 2011 show a hemoglobin of 10.0 with MCV 85 and ferritin level 18. His CEA level was 2.1. Though this is normal, but represents an increase from 1.0 12 months earlier. He has received iron infusion therapy end is on oral iron. Hemoccult cards were also performed and returned positive x3. Patient denies any upper or lower GI symptoms. His last colonoscopy was in October 2006 at the time of surgery. In addition to his tumor, he was noted to have multiple right colon AVMs. Upper endoscopy from August 2007 revealed short segment Barrett's esophagus and a hiatal hernia. The examination was being performed at that time for microcytic anemia. He continues on omeprazole 20 mg daily.   GI Review of Systems    Reports acid reflux and  weight gain.      Denies abdominal pain, belching, bloating, chest  pain, dysphagia with liquids, dysphagia with solids, heartburn, loss of appetite, nausea, vomiting, vomiting blood, and  weight loss.      Reports black tarry stools and  heme positive stool.     Denies anal fissure, change in bowel habit, constipation, diarrhea, diverticulosis, fecal incontinence, hemorrhoids, irritable bowel syndrome, jaundice, light color stool, liver problems, rectal bleeding, and  rectal pain.    Current Medications (verified): 1)  Omeprazole 20 Mg Cpdr (Omeprazole) .... Once Daily As Needed 2)  Sertraline Hcl 100 Mg Tabs (Sertraline Hcl) .... One By Mouth Daily 3)  Centrum Silver Ultra Mens  Tabs (Multiple Vitamins-Minerals) .... Once Daily 4)  Feosol 200 (65 Fe) Mg Tabs (Ferrous Sulfate Dried) .... Once Daily 5)  Aricept 10 Mg Tabs (Donepezil Hcl) .... One By Mouth Once Daily  Allergies (verified): 1)  ! Codeine Sulfate (Codeine Sulfate)  Past History:  Past Medical History: Reviewed history from 01/31/2009 and no changes required. Anemia Osteoarthritis, kneew Cancer lower colon 2006 Stroke Subdural hematoma 2009 (right) COPD Previous Smoker PFO Hx CVA MEMORY LOSS (ICD-780.93) PLEURAL EFFUSION, LEFT (ICD-511.9) PNEUMONIA, COMMUNITY ACQUIRED, PNEUMOCOCCAL (ICD-481) CHEST PAIN, PLEURITIC (ICD-786.52) HYPERTENSION (ICD-401.9) SUBDURAL HEMATOMA (ICD-432.1) ADENOCARCINOMA, COLON, HX OF (ICD-V10.05) DEPRESSION (ICD-311) GERD (ICD-530.81)    Past Surgical History: Reviewed history from 11/21/2008 and no changes required. Lower colon cancer removal 2006 Ascending Aorta Aneurysm 2007 Ascending Aortic valve replacement 2007 Upper heart chamber hole closure 2007  (PFO) Subdural Hematoma 2009 Cholecystectomy  2006  Family History: Reviewed history from 01/31/2009 and no changes required. heart disease: father cancer: father (unsure what kind), brother (prostate)   Social History: Reviewed history from 01/31/2009 and  no changes required. pt is  widowed. pt has children. pt is retired.  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 pt started at age 56.  1 ppd. quit 1985.  Review of Systems  The patient denies allergy/sinus, anemia, anxiety-new, arthritis/joint pain, back pain, blood in urine, breast changes/lumps, change in vision, confusion, cough, coughing up blood, depression-new, fainting, fatigue, fever, headaches-new, hearing problems, heart murmur, heart rhythm changes, itching, menstrual pain, muscle pains/cramps, night sweats, nosebleeds, pregnancy symptoms, shortness of breath, skin rash, sleeping problems, sore throat, swelling of feet/legs, swollen lymph glands, thirst - excessive , urination - excessive , urination changes/pain, urine leakage, vision changes, and voice change.    Vital Signs:  Patient profile:   77 year old male Height:      72 inches Weight:      175.38 pounds BMI:     23.87 Pulse rate:   60 / minute Pulse rhythm:   regular BP sitting:   90 / 62  (left arm) Cuff size:   regular  Vitals Entered By: June McMurray CMA Duncan Dull) (Nov 08, 2009 8:32 AM)  Physical Exam  General:  Well developed, well nourished, no acute distress. Head:  Normocephalic and atraumatic. Eyes:  PERRLA, no icterus. Nose:  No deformity, discharge,  or lesions. Mouth:  No deformity or lesions. Neck:  Supple; no masses or thyromegaly. Lungs:  Clear throughout to auscultation. Heart:  Regular rate and rhythm; no murmurs, rubs,  or bruits. Abdomen:  Soft, nontender and nondistended. No masses, hepatosplenomegaly or hernias noted. Normal bowel sounds.. Prior surgical incision well healed Rectal:  deferred until colonoscopy Prostate:  deferred until colonoscopy Msk:  Symmetrical with no gross deformities. Normal posture. Pulses:  Normal pulses noted. Extremities:  No clubbing, cyanosis, edema or deformities noted. Neurologic:  Alert and  oriented x4;  grossly normal neurologically. Skin:   Intact without significant lesions or rashes. Psych:  Alert and cooperative. Normal mood and affect.   Impression & Recommendations:  Problem # 1:  ANEMIA-IRON DEFICIENCY (ICD-53.51) 77 year old with multiple medical problems who presents today with iron deficiency anemia and Hemoccult-positive stool. The most likely cause for this problem is chronic blood loss from arteriovenous malformations of the colon. Prior upper endoscopy for microcytic anemia with results as noted. Prior colonoscopy in 2006 and subsequent segmental colon resection. Though the patient could have neoplasia to explain iron deficiency anemia and Hemoccult-positive stool, I think that this would be less likely given the other obvious cause as well as his asymptomatic state and ongoing healthy weight gain. I did telephone the patient's oncologist, Dr. Myna Hidalgo, this morning to discuss the patient's case as well as possible plans pending further evaluation and/or findings. I've also discussed this impression with the patient and his son. The following has been decided...  Plan: #1. Colonoscopy. The nature of the procedure as well as the risks, benefits, and alternatives have been reviewed. He understood and agreed to proceed. The patient is at HIGH RISK given his age and comorbidities. However, he is deemed appropriate and fit at this time for the procedure. #2. Movi prep prescribed. The patient instructed on its use.  Problem # 2:  FECAL OCCULT BLOOD (ICD-792.1) likely due to AVMs. Doubt upper GI lesion given prior EGD results and ongoing omeprazole use. Will evaluate the colon to rule out neoplasia. See above.  Problem # 3:  ANGIODYSPLASIA-INTESTINE (ICD-569.84) no AVMs of the right colon. Likely cause for Hemoccult-positive stool and anemia.  Plan: #  1. If no additional GI mucosal abnormalities found, then I would recommend chronic oral iron therapy and periodic monitoring of his hemoglobin with either his oncologist or primary  care physician. If he develops anemia despite oral iron therapy, then consider periodic iron transfusion and/or blood transfusion as clinically indicated. Again, under their direction. For clinically significant refractory anemia or overt bleeding, endoscopic APC ablation therapy could be considered.  Problem # 4:  Elevated CEA his CEA level is actually normal, though slightly elevated compared to previous levels over the past year. Future monitoring of this test and possible need for further investigation will be under the direction of Dr. Myna Hidalgo. Patient and his son are aware.  Other Orders: Colonoscopy (Colon)  Patient Instructions: 1)  Colonoscopy [facility] 11/09/09 4:00 pm arrive at 3:00 pm 2)  Movi prep instructions given and Rx. sent to pharmacy for you to pick up today. 3)  Colonoscopy and Flexible Sigmoidoscopy brochure given.  4)  The medication list was reviewed and reconciled.  All changed / newly prescribed medications were explained.  A complete medication list was provided to the patient / caregiver. 5)  printed and given to patient. Milford Cage [facility]  Nov 08, 2009 9:14 AM 6)  copy: Dr. Arlan Organ, Dr. Evelena Peat Prescriptions: MOVIPREP 100 GM  SOLR (PEG-KCL-NACL-NASULF-NA ASC-C) As per prep instructions.  #1 x 0   Entered by:   Milford Cage NCMA   Authorized by:   Hilarie Fredrickson MD   Signed by:   Milford Cage NCMA on 11/08/2009   Method used:   Electronically to        [facility]* (retail)       [address] [place], [place]  [address]       Ph: [phone] or [phone]       Fax: [phone]   RxID:   [id number]

## 2010-07-23 NOTE — Progress Notes (Signed)
   Phone Note Other Incoming   Summary of Call: Son called in and was informed of CT. results and the referral plans. Initial call taken by: Teryl Lucy RN,  Nov 15, 2009 8:30 AM

## 2010-07-23 NOTE — Assessment & Plan Note (Signed)
Summary: 4 month follow-up/nn   Vital Signs:  Patient profile:   77 year old male Weight:      164 pounds O2 Sat:      91 % on Room air Temp:     97.4 degrees F oral Pulse rate:   108 / minute Pulse rhythm:   regular BP sitting:   122 / 70  (left arm) Cuff size:   regular  Vitals Entered By: Sid Falcon LPN (March 04, 2010 1:20 PM)  O2 Flow:  Room air  History of Present Illness: Patient seen for followup. Recent colon cancer surgery. Complication of postoperative pneumonia. patient placed recently on Spiriva and Symbicort and has been compliant with taking these as instructed. Lung status is stable. Occasional dry cough. No recent fever or chills.  Social history complicated by recent death of son who was his primary caregiver. He has 2 other sons and a sister who are taking care of him at this point. He has physical therapy twice per week.   Getting 24-hour supervision currently.  Appetite has improved. He feels depression is stable on sertraline. Overall seems to be coping fairly well loss of his son.  Allergies: 1)  ! 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) PLEURAL EFFUSION, LEFT (ICD-511.9) with LL Atx...........................Marland KitchenClance      - FOB 02/06/09 no abnormalities,  routine cultures neg PNEUMONIA, COMMUNITY ACQUIRED, PNEUMOCOCCAL (ICD-481) CHEST PAIN, PLEURITIC (ICD-786.52) HYPERTENSION (ICD-401.9) SUBDURAL HEMATOMA (ICD-432.1) ADENOCARCINOMA, COLON, HX OF (ICD-V10.05) DEPRESSION (ICD-311) GERD (ICD-530.81)    Past Surgical History: Last updated: 11/21/2008 Lower colon cancer removal 2006 Ascending Aorta Aneurysm 2007 Ascending Aortic valve replacement 2007 Upper heart chamber hole closure 2007  (PFO) Subdural Hematoma  2009 Cholecystectomy  2006 PMH reviewed for relevance, SH/Risk Factors reviewed for relevance  Physical Exam  General:  alert and cooperative gentleman in no distress Mouth:  edentulous. No thrush and no oral lesions Neck:  No deformities, masses, or tenderness noted. Lungs:  somewhat diminished breath sounds throughout but clear. No wheezes or rales Heart:  normal rate and regular rhythm.   Extremities:  no pitting edema Psych:  good eye contact, not agitated, not suicidal, depressed affect, and subdued.     Impression & Recommendations:  Problem # 1:  COPD (ICD-496) flu vaccine is recommended and pt consents His updated medication list for this problem includes:    Albuterol Sulfate (2.5 Mg/9ml) 0.083% Nebu (Albuterol sulfate) ..... Every 6 hours    Symbicort 160-4.5 Mcg/act Aero (Budesonide-formoterol fumarate) .[name]... 2 puffs two times a day    Spiriva Handihaler 18 Mcg Caps (Tiotropium bromide monohydrate) ..... One puff daily  Problem # 2:  DEPRESSION (ICD-311)  His updated medication list for this problem includes:    Sertraline Hcl 100 Mg Tabs (Sertraline hcl) ..... One by mouth daily  Problem # 3:  Preventive Health Care (ICD-V70.0) needs flu vaccine and this will be given today.  PVX 2008.  Complete Medication List: 1)  Omeprazole 20 Mg Cpdr (Omeprazole) .... Once daily 2)  Sertraline Hcl 100 Mg Tabs (Sertraline hcl) .... One by mouth daily 3)  Centrum Silver Ultra Mens Tabs (Multiple vitamins-minerals) .... Once daily 4)  Aricept 10 Mg Tabs (Donepezil hcl) .... One by mouth once daily 5)  Albuterol Sulfate (2.5 Mg/34ml) 0.083% Nebu (Albuterol sulfate) .... Every 6 hours 6)  Ferrous Sulfate 325 (65 Fe) Mg Tabs (Ferrous sulfate) .... Bid 7)  Aspirin 81 Mg Tbec (Aspirin) .... Take 1 tablet by mouth once a day 8)  Symbicort 160-4.5 Mcg/act Aero (Budesonide-formoterol fumarate) .... 2 puffs two times a day 9)  Spiriva Handihaler 18 Mcg Caps (Tiotropium bromide monohydrate)  .... One puff daily  Other Orders: Flu Vaccine 45yrs + MEDICARE PATIENTS (Z6109) Administration Flu vaccine - MCR (U0454)  Patient Instructions: 1)  Please schedule a follow-up appointment in 3 months .       Flu Vaccine Consent Questions     Do you have a history of severe allergic reactions to this vaccine? no    Any prior history of allergic reactions to egg and/or gelatin? no    Do you have a sensitivity to the preservative Thimersol? no    Do you have a past history of Guillan-Barre Syndrome? no    Do you currently have an acute febrile illness? no    Have you ever had a severe reaction to latex? no    Vaccine information given and explained to patient? yes    Are you currently pregnant? no    Lot Number:[id number]   Exp Date:12/21/2010   Site Given  Left Deltoid IMdflu

## 2010-07-23 NOTE — Progress Notes (Signed)
   Phone Note Outgoing Call   Summary of Call: Pt. has appt. with Dr.Wyatt on 11/27/09 ct. and path results faxed to him Initial call taken by: Teryl Lucy RN,  Nov 14, 2009 2:58 PM

## 2010-07-23 NOTE — Progress Notes (Signed)
Summary: ASAP appts.    Phone Note From Other Clinic Call back at [phone]   Caller: Nurse-Ellen Summary of Call: Patients HGB is 10 and patient has colon cancer his CA is rising patient needs a ASAP appt with someone. Alvino Chapel is faxing notes. Initial call taken by: Harlow Mares CMA Duncan Dull),  October 18, 2009 9:33 AM  Follow-up for Phone Call        Records reviewed by Dr.Lamonda Noxon.Given ov for 11/08/09 Pt. to be put on iron t.i.d. Jo-ellen at Dr.Ennever's office ntfd. as Alvino Chapel is out of office today and she will discuss with Dr.Ennever and call back Follow-up by: Teryl Lucy RN,  October 19, 2009 10:19 AM  Additional Follow-up for Phone Call Additional follow up Details #1::        Jo-ellen ret.d call and Dr.Ennever  aware of Dr.Jaquanda Wickersham's recommendations.Pt. has not had CT since 07. Additional Follow-up by: Teryl Lucy RN,  October 19, 2009 11:06 AM

## 2010-07-23 NOTE — Progress Notes (Signed)
Summary: O2 Sats low  Phone Note Call from Patient   Caller: Patient Call For: Evelena Peat MD Summary of Call: Pulse Ox is dropping to low 80's upon exercise, and recovers to 93 at rest. Heart Rate 100 BP 110/50 Only complains of weakness. Leave message. Initial call taken by: Lynann Beaver CMA,  February 06, 2010 11:42 AM  Follow-up for Phone Call        pt is recovering from PNA  and has hx of COPD.  Would observe if sats OK at rest. Follow-up by: Evelena Peat MD,  February 06, 2010 12:52 PM  Additional Follow-up for Phone Call Additional follow up Details #1::        Phone Call Completed Additional Follow-up by: Rudy Jew, RN,  February 06, 2010 1:57 PM

## 2010-07-23 NOTE — Progress Notes (Signed)
Summary: sleep meds  Phone Note Call from Patient   Caller: Son Call For: Gerald Peat MD Summary of Call: Pt needs RX for sleep called to [facility]) [phone]  Initial call taken by: Lynann Beaver CMA,  March 22, 2010 9:07 AM  Follow-up for Phone Call        [name], please call.......... sleeping medication can have a severe CNS depression, and the patient also has underlying COPD, therefore, referred to pulmonary to discuss sleeping medications or see Dr. Caryl Never when he comes back. Follow-up by: Roderick Pee MD,  March 22, 2010 9:39 AM  Additional Follow-up for Phone Call Additional follow up Details #1::        Pts son,Barry, called to check on status of sleep med for his father. Pls call [name] at [phone] when this has been done.  Additional Follow-up by: Lucy Antigua,  March 22, 2010 2:28 PM    Additional Follow-up for Phone Call Additional follow up Details #2::    Called pt son, gave him Dr Barbette Or advice, pt does havd a pulmonologist and they willcall them. Follow-up by: Sid Falcon LPN,  March 22, 2010 2:37 PM

## 2010-07-23 NOTE — Letter (Signed)
[facility]   Im[facility] By: Sherian Rein 12/28/2009 10:16:20  _____________________________________________________________________  External Attachment:    Type:   Image     Comment:   External Document

## 2010-07-23 NOTE — Progress Notes (Signed)
Summary: REFILL REQUEST (Omeprazole)  Phone Note Refill Request Message from:  Fax from Pharmacy on Nov 16, 2009 8:53 AM  Refills Requested: Medication #1:  OMEPRAZOLE 20 MG CPDR once daily as needed   Notes: [facility] - Qty-30 cap    Initial call taken by: Debbra Riding,  Nov 16, 2009 8:54 AM    Prescriptions: OMEPRAZOLE 20 MG CPDR (OMEPRAZOLE) once daily as needed  #30 x 6   Entered by:   Sid Falcon LPN   Authorized by:   Evelena Peat MD   Signed by:   Sid Falcon LPN on 16/03/9603   Method used:   Electronically to        [facility]* (retail)       [address] [place], [place]  [address]       Ph: [phone] or [phone]       Fax: [phone]   RxID:   [id number]

## 2010-07-23 NOTE — Letter (Signed)
Summary: [facility]   Imported By: Sherian Rein 03/07/2010 13:17:45  _____________________________________________________________________  External Attachment:    Type:   Image     Comment:   External Document

## 2010-07-23 NOTE — Assessment & Plan Note (Signed)
Summary: FOLLOW UP ON MED CHECK/CJR/PT RSC/CJR   Vital Signs:  Patient profile:   77 year old male Weight:      163 pounds Temp:     97.8 degrees F oral BP sitting:   130 / 70  (left arm) Cuff size:   regular  Vitals Entered By: Sid Falcon LPN (August 23, 4780 1:27 PM) CC: follow-up visit, discuss memory med   History of Present Illness: Patient accompanied by son to discuss issues regarding confusion and forgetfulness which have been progressive over recent weeks if not months. Patient treated for depression with sertraline. Several months ago had some improvement in Mini-Mental Status after treating depression. At this point depression seems stable. Eating well. No crying spells.  Good weight gain since increasing Zoloft.  As example of forgetfulness yesterday ran out of gas. Has forgotten and left his microwave on prolonged settings. Becoming more forgetful for things like day of week and year.  Hx large subdural hematoma > 1 year ago and released from neurosurgeon care.  Allergies: 1)  ! Codeine Sulfate (Codeine Sulfate)  Past History:  Past Medical History: Last updated: 01/31/2009 Anemia Osteoarthritis, kneew Cancer lower colon 2006 Stroke Subdural hematoma 2009 (right) COPD Previous Smoker PFO Hx CVA MEMORY LOSS (ICD-780.93) PLEURAL EFFUSION, LEFT (ICD-511.9) PNEUMONIA, COMMUNITY ACQUIRED, PNEUMOCOCCAL (ICD-481) CHEST PAIN, PLEURITIC (ICD-786.52) HYPERTENSION (ICD-401.9) SUBDURAL HEMATOMA (ICD-432.1) ADENOCARCINOMA, COLON, HX OF (ICD-V10.05) DEPRESSION (ICD-311) GERD (ICD-530.81)    Past Surgical History: Last updated: 11/21/2008 Lower colon cancer removal 2006 Ascending Aorta Aneurysm 2007 Ascending Aortic valve replacement 2007 Upper heart chamber hole closure 2007  (PFO) Subdural Hematoma 2009 Cholecystectomy  2006  Social History: Last updated: 01/31/2009 pt is widowed. pt has children. pt is retired.  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 Patient states former smoker. pt started at age 46.  1 ppd. quit 1985. PMH-FH-SH reviewed for relevance  Review of Systems  The patient denies anorexia, fever, weight loss, chest pain, syncope, dyspnea on exertion, peripheral edema, headaches, and abdominal pain.    Physical Exam  General:  Well-developed,well-nourished,in no acute distress; alert,appropriate and cooperative throughout examination Neck:  No deformities, masses, or tenderness noted. Lungs:  Normal respiratory effort, chest expands symmetrically. Lungs are clear to auscultation, no crackles or wheezes. Heart:  normal rate and regular rhythm.   Psych:  patient is not oriented to day of week or year but is oriented to place.good eye contact, not anxious appearing, and not depressed appearing.     Impression & Recommendations:  Problem # 1:  MEMORY LOSS (ICD-780.93) patient has element of dementia possibly accelerated by large subdural hematoma and head trauma over one year ago. Depression seems stable. Start Aricept 5mg  daily and titrate to 10 mg in one month.  Reassess two months.  Discussed safety with son.  He has taken away car keys and will remove any firearms and unplug stove.  Recheck MMSE in 2 months.  Problem # 2:  DEPRESSION (ICD-311) Assessment: Improved  His updated medication list for this problem includes:    Sertraline Hcl 100 Mg Tabs (Sertraline hcl) ..... One by mouth daily  Complete Medication List: 1)  Omeprazole 20 Mg Cpdr (Omeprazole) .... Once daily as needed 2)  Sertraline Hcl 100 Mg Tabs (Sertraline hcl) .... One by mouth daily 3)  Centrum Silver Ultra Mens Tabs (Multiple vitamins-minerals) .... Once daily 4)  Feosol 200 (65 Fe) Mg Tabs (Ferrous sulfate dried) .... Once daily 5)  Aricept 5 Mg  Tabs (Donepezil hcl) .... One by mouth once daily for one month then titrate to 10 mg dose. 6)  Aricept 10 Mg Tabs (Donepezil hcl) .... One by mouth once daily  Patient Instructions: 1)   Please schedule a follow-up appointment in 2 months.  2)  Get rid of any firearms in your house. 3)  Consider unplugging stove. 4)  Avoid driving at this time. Prescriptions: ARICEPT 10 MG TABS (DONEPEZIL HCL) one by mouth once daily  #30 x 3   Entered and Authorized by:   Evelena Peat MD   Signed by:   Evelena Peat MD on 08/23/2009   Method used:   Print then Give to Patient   RxID:   [id number] ARICEPT 5 MG TABS (DONEPEZIL HCL) one by mouth once daily for one month then titrate to 10 mg dose.  #30 x 0   Entered and Authorized by:   Evelena Peat MD   Signed by:   Evelena Peat MD on 08/23/2009   Method used:   Print then Give to Patient   RxID:   [id number]

## 2010-07-23 NOTE — Progress Notes (Signed)
Summary: increase Zoloft? Start Aricept?  Phone Note Call from Patient   Caller: Patient Call For: Evelena Peat MD Summary of Call: Gerald Dyer (son) states that the Zoloft helped for a while with his memory, but he thinks the dosage may need to be increased. Initial call taken by: Lynann Beaver CMA,  July 30, 2009 2:48 PM  Follow-up for Phone Call        titirate to 100 mg per day on the Sertraline and let's plan office f/u in 2-3 weeks after changing dose to reassess. Follow-up by: Evelena Peat MD,  July 30, 2009 3:02 PM  Additional Follow-up for Phone Call Additional follow up Details #1::        Pt's son notified. Additional Follow-up by: Lynann Beaver CMA,  July 30, 2009 3:06 PM    New/Updated Medications: SERTRALINE HCL 100 MG TABS (SERTRALINE HCL) one by mouth daily

## 2010-07-23 NOTE — Assessment & Plan Note (Signed)
Summary: rov for emphysema   Copy to:  Evelena Peat Primary Provider/Referring Provider:  Evelena Peat MD  CC:  4 week follow up. pt states breahting is better from last visit. pt states he still ahs some SOB with acitivity.  History of Present Illness: the pt comes in today for f/u of his presumed copd.  He was in the hospital earlier this year and suffered a bout of aspiration pna.  At the last visit, he was having issues with his breathing, and the decision was made to treat him for copd (his ct chest showed severe emphysematous changes).  The plan was to get full pfts when he was stronger.  He comes in today where his breathing is doing much better on a bronchodilator regimen, although he is also further out from his aspiration insult.  He denies significant cough or congestion at this time.  Current Medications (verified): 1)  Omeprazole 20 Mg Cpdr (Omeprazole) .... Once Daily 2)  Sertraline Hcl 100 Mg Tabs (Sertraline Hcl) .... One By Mouth Daily 3)  Centrum Silver Ultra Mens  Tabs (Multiple Vitamins-Minerals) .... Once Daily 4)  Aricept 10 Mg Tabs (Donepezil Hcl) .... One By Mouth Once Daily 5)  Albuterol Sulfate (2.5 Mg/35ml) 0.083% Nebu (Albuterol Sulfate) .... Every 6 Hours As Needed 6)  Ferrous Sulfate 325 (65 Fe) Mg Tabs (Ferrous Sulfate) .... Bid 7)  Aspirin 81 Mg Tbec (Aspirin) .... Take 1 Tablet By Mouth Once A Day 8)  Symbicort 160-4.5 Mcg/act Aero (Budesonide-Formoterol Fumarate) .... 2 Puffs Two Times A Day 9)  Spiriva Handihaler 18 Mcg Caps (Tiotropium Bromide Monohydrate) .... One Puff Daily 10)  Donepezil Hcl 10 Mg Tabs (Donepezil Hcl) .... One By Mouth Daily  Allergies (verified): 1)  ! Codeine Sulfate (Codeine Sulfate)  Review of Systems       The patient complains of shortness of breath with activity and non-productive cough.  The patient denies shortness of breath at rest, productive cough, coughing up blood, chest pain, irregular heartbeats, acid heartburn,  indigestion, loss of appetite, weight change, abdominal pain, difficulty swallowing, sore throat, tooth/dental problems, headaches, nasal congestion/difficulty breathing through nose, sneezing, itching, ear ache, anxiety, depression, hand/feet swelling, joint stiffness or pain, rash, change in color of mucus, and fever.    Vital Signs:  Patient profile:   77 year old male Height:      72 inches Weight:      166.25 pounds O2 Sat:      96 % on Room air Temp:     97.7 degrees F oral Pulse rate:   96 / minute BP sitting:   134 / 70  (left arm) Cuff size:   regular  Vitals Entered By: Carver Fila (March 13, 2010 2:28 PM)  O2 Flow:  Room air CC: 4 week follow up. pt states breahting is better from last visit. pt states he still ahs some SOB with acitivity Comments meds and allergies updated Phone number updated Carver Fila  March 13, 2010 2:28 PM    Physical Exam  General:  frail, thin male in nad Lungs:  minimal right basilar crackles no wheezing or rhonchi Heart:  rrr, no mrg Extremities:  no edema or cyanosis  Neurologic:  alert and oriented, moves all 4.   Impression & Recommendations:  Problem # 1:  COPD (ICD-496) the pt has presumed "copd" and is feeling better on his current bronchodilator regimen.  Would like to schedule for pfts to document whether he truy has airflow obstruction.  I have also asked him to work on some type of conditioning program to regain his strength.  I will call him with the results of the pfts.  Medications Added to Medication List This Visit: 1)  Albuterol Sulfate (2.5 Mg/74ml) 0.083% Nebu (Albuterol sulfate) .... Every 6 hours as needed  Other Orders: Est. Patient Level III (11914) Pulmonary Referral (Pulmonary)  Patient Instructions: 1)  no change in meds.  Only use the nebulizer machine if having a bad day breathing 2)  will schedule for breathing studies in about 3 weeks.Marland KitchenMarland KitchenI will call you with the results. 3)  If doing well,  followup with me again in 6mos.    Appended Document: rov for emphysema pt's pfts were reviewed.  His lung volumes and dlco were inaccurate due to leaks.  His spirometry showed a normal FEV1%, but FVC moderately reduced.  Unclear if due to restriction vs airtrapping without lung volumes, but favor the latter with the FVL suggestive of obstruction.  Will continue meds for now except trial off symbicort.  Is to stay on spiriva and as needed albuterol  please let pt know that he had a hard time with breathing tests, but I suspect that he has some degree of emphysema.  let him know recommendation for inhalers above.  Appended Document: rov for emphysema lmomtcb x1  Appended Document: rov for emphysema see 04/04/10 PN- Done.

## 2010-07-23 NOTE — Progress Notes (Signed)
Summary: incontinence (urinary)  Phone Note Call from Patient   Caller: Patient Call For: Gerald Peat MD Summary of Call: Dad is having probs with "Fovent" and is having urinary incontinence this am......Marland KitchenNo dysuria, no urgency [phone] Earnest Rosier.  No fever and feels fine.  No blood in Urine. Initial call taken by: Lynann Beaver CMA,  February 04, 2010 8:59 AM  Follow-up for Phone Call        observe for couple of days.  If persists or he develops any fever or new urine symptoms would need to check UA and assess. Follow-up by: Gerald Peat MD,  February 04, 2010 10:14 AM  Additional Follow-up for Phone Call Additional follow up Details #1::        Pt's son notified. Additional Follow-up by: Lynann Beaver CMA,  February 04, 2010 10:24 AM

## 2010-07-23 NOTE — Assessment & Plan Note (Signed)
Summary: post hosp fup/ok per doc/njr   Vital Signs:  Patient profile:   77 year old male Weight:      160 pounds Temp:     97.5 degrees F oral BP sitting:   94 / 62  (left arm) Cuff size:   regular  Vitals Entered By: Duard Brady LPN (February 01, 2010 4:02 PM) CC: post hospital Is Patient Diabetic? No   History of Present Illness: Recent surgery for colon cancer.  Pt discharged 2 days ago. Post op complicated by PNA.  Pt discarged on Avelox and afebrile with only minimal cough. He has some weakness overall but eating fairly well.  Pt is ex-smoker and has COPD hx .  Started in hosp. on budesonide neb and requesting prior  authorization at this time.  Also using albuterol.  Some productive cough of clear sputum early mornings mostly.  Hx depression stable on Sertraline.  Mood OK.  Appetite improving. GERD symptoms stable.  Allergies: 1)  ! 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) PLEURAL EFFUSION, LEFT (ICD-511.9) with LL Atx...........................Marland KitchenClance      - FOB 02/06/09 no abnormalities,  routine cultures neg PNEUMONIA, COMMUNITY ACQUIRED, PNEUMOCOCCAL (ICD-481) CHEST PAIN, PLEURITIC (ICD-786.52) HYPERTENSION (ICD-401.9) SUBDURAL HEMATOMA (ICD-432.1) ADENOCARCINOMA, COLON, HX OF (ICD-V10.05) DEPRESSION (ICD-311) GERD (ICD-530.81)    Past Surgical History: Last updated: 11/21/2008 Lower colon cancer removal 2006 Ascending Aorta Aneurysm 2007 Ascending Aortic valve replacement 2007 Upper heart chamber hole closure 2007  (PFO) Subdural Hematoma 2009 Cholecystectomy  2006  Family History: Last updated: 01/31/2009 heart disease: father cancer: father (unsure what kind), brother (prostate)   Social History: Last updated: 01/31/2009 pt is  widowed. pt has children. pt is retired.  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 pt started at age 77.  1 ppd. quit 1985.  Risk Factors: Smoking Status: quit (01/31/2009)  Review of Systems  The patient denies anorexia, fever, chest pain, syncope, dyspnea on exertion, peripheral edema, prolonged cough, headaches, hemoptysis, abdominal pain, melena, hematochezia, severe indigestion/heartburn, and hematuria.    Physical Exam  General:  Well-developed,well-nourished,in no acute distress; alert,appropriate and cooperative throughout examination Head:  Normocephalic and atraumatic without obvious abnormalities. No apparent alopecia or balding. Ears:  External ear exam shows no significant lesions or deformities.  Otoscopic examination reveals clear canals, tympanic membranes are intact bilaterally without bulging, retraction, inflammation or discharge. Hearing is grossly normal bilaterally. Mouth:  Oral mucosa and oropharynx without lesions or exudates.  Teeth in good repair. Neck:  No deformities, masses, or tenderness noted. Lungs:  slighlty diminished breath sounds throughout but no wheezes or rales. Heart:  normal rate and regular rhythm.   Abdomen:  soft and non-tender.   Extremities:  No clubbing, cyanosis, edema, or deformity noted with normal full range of motion of all joints.     Impression & Recommendations:  Problem # 1:  COPD (ICD-496) complicated by recent postsurgical PNA.  We will try to get budesonide "preauthorized."  In meantime, pt running out of med and we provided sample of Flovent disk and instruction for use. His updated medication list for this problem includes:    Albuterol Sulfate (2.5 Mg/21ml) 0.083% Nebu (Albuterol sulfate) ..... Q3hrs prn  Problem # 2:  ADENOCARCINOMA, COLON, HX OF (ICD-V10.05)  Problem # 3:  DEPRESSION (ICD-311) Assessment:  Unchanged  His updated medication list for this problem  includes:    Sertraline Hcl 100 Mg Tabs (Sertraline hcl) ..... One by mouth daily  Problem # 4:  GERD (ICD-530.81) Assessment: Unchanged  His updated medication list for this problem includes:    Omeprazole 20 Mg Cpdr (Omeprazole) ..... Once daily as needed  Complete Medication List: 1)  Omeprazole 20 Mg Cpdr (Omeprazole) .... Once daily as needed 2)  Sertraline Hcl 100 Mg Tabs (Sertraline hcl) .... One by mouth daily 3)  Centrum Silver Ultra Mens Tabs (Multiple vitamins-minerals) .... Once daily 4)  Aricept 10 Mg Tabs (Donepezil hcl) .... One by mouth once daily 5)  Albuterol Sulfate (2.5 Mg/84ml) 0.083% Nebu (Albuterol sulfate) .... Q3hrs prn 6)  Ferrous Sulfate 325 (65 Fe) Mg Tabs (Ferrous sulfate) .... Bid 7)  Avelox Abc Pack 400 Mg Tabs (Moxifloxacin hcl) 8)  Oxycodone-acetaminophen 5-325 Mg Tabs (Oxycodone-acetaminophen) .[name].. 1-2 by mouth q4 hrs as needed pain

## 2010-07-23 NOTE — Progress Notes (Signed)
Summary: talk to nurse---pt in hosp  Phone Note Call from Patient Call back at [phone]   Caller: Son//scott Call For: clance Summary of Call: States dad in in [facility] rm 5152 dx pneumonia, would like [facility] to see him since he's familiar with him, pls advise. Initial call taken by: Darletta Moll,  January 30, 2010 2:50 PM  Follow-up for Phone Call        [facility], are you willing to do this for this patient? Please advise.Reynaldo Minium CMA  January 30, 2010 3:02 PM   Additional Follow-up for Phone Call Additional follow up Details #1::        will see him in hospital Additional Follow-up by: Barbaraann Share MD,  January 30, 2010 5:38 PM

## 2010-07-23 NOTE — Miscellaneous (Signed)
Summary: Orders Update pft charges  Clinical Lists Changes  Orders: Added new Service order of Spirometry (Pre & Post) (94060) - Signed 

## 2010-07-23 NOTE — Progress Notes (Signed)
Summary: Call A Nurse   Call-A-Nurse Triage Call Report Triage Record Num: [id number] Operator: Remonia Richter Patient Name: Gerald Dyer Call Date & Time: 02/06/2010 6:49:03PM Patient Phone: PCP: Evelena Peat Patient Gender: Male PCP Fax : [phone] Patient DOB: 1924-08-07 [facility] Name: [facility] Reason for Call: Son/Scott: d/c less than week (pneumonia) from hospital . seems to be getting weaker instead of better. , seen today by home health RN and they were to get back to son with response from office about his condition, son not with dad at present, concerned about breathing, encouraged to go check on dad tonight and call the home health service he is using for the information from his RN today, may call back for triage as needed,may go0 to ER if conc=dition emergent upon arrival Protocol(s) Used: Office Note Recommended Outcome per Protocol: Information Noted and Sent to Office Reason for Outcome: Caller information to office Care Advice:  ~ 08/

## 2010-07-23 NOTE — Progress Notes (Signed)
Summary: colon ca  Phone Note Call from Patient   Caller: Son Call For: Gerald Peat MD Summary of Call: Pt's son called to let Dr. Caryl Never know that Dr. Marina Goodell did a colonoscopy on pt. and found recurrent cancer.  Next step is a CT scan.   [phone] Initial call taken by: Lynann Beaver CMA,  Nov 12, 2009 1:33 PM  Follow-up for Phone Call        noted. Follow-up by: Gerald Peat MD,  Nov 12, 2009 1:55 PM

## 2010-07-23 NOTE — Letter (Signed)
Summary: [facility] Instructions  [facility]  [address] [place], [place] [address]   Phone: [phone]  Fax: [phone]       Gerald Dyer    Mar 17, 1925    MRN: [id number]        Procedure Day /Date:FRIDAY, 11/09/09     Arrival Time:3:00 PM     Procedure Time:4:00 PM     Location of Procedure:                    X  [facility] (4th Floor)                        PREPARATION FOR COLONOSCOPY WITH MOVIPREP   Starting 5 days prior to your procedure CLEAR LIQUIDS ONLY STARTING TODAY do not eat nuts, seeds, popcorn, corn, beans, peas,  salads, or any raw vegetables.  Do not take any fiber supplements (e.g. Metamucil, Citrucel, and Benefiber).  THE DAY BEFORE YOUR PROCEDURE         DATE:11/08/09 DAY: TODAY  1.  Drink clear liquids the entire day-NO SOLID FOOD  2.  Do not drink anything colored red or purple.  Avoid juices with pulp.  No orange juice.  3.  Drink at least 64 oz. (8 glasses) of fluid/clear liquids during the day to prevent dehydration and help the prep work efficiently.  CLEAR LIQUIDS INCLUDE: Water Jello Ice Popsicles Tea (sugar ok, no milk/cream) Powdered fruit flavored drinks Coffee (sugar ok, no milk/cream) Gatorade Juice: apple, white grape, white cranberry  Lemonade Clear bullion, consomm, broth Carbonated beverages (any kind) Strained chicken noodle soup Hard Candy                             4.  In the morning, mix first dose of MoviPrep solution:    Empty 1 Pouch A and 1 Pouch B into the disposable container    Add lukewarm drinking water to the top line of the container. Mix to dissolve    Refrigerate (mixed solution should be used within 24 hrs)  5.  Begin drinking the prep at 5:00 p.m. The MoviPrep container is divided by 4 marks.   Every 15 minutes drink the solution down to the next mark (approximately 8 oz) until the full liter is complete.   6.  Follow completed prep with 16 oz of clear liquid of  your choice (Nothing red or purple).  Continue to drink clear liquids until bedtime.  7.  Before going to bed, mix second dose of MoviPrep solution:    Empty 1 Pouch A and 1 Pouch B into the disposable container    Add lukewarm drinking water to the top line of the container. Mix to dissolve    Refrigerate  THE DAY OF YOUR PROCEDURE      DATE: 11/09/09 DAY: FRIDAY  Beginning at 11:00 a.m. (5 hours before procedure):         1. Every 15 minutes, drink the solution down to the next mark (approx 8 oz) until the full liter is complete.  2. Follow completed prep with 16 oz. of clear liquid of your choice.    3. You may drink clear liquids until 2:00 PM (2 HOURS BEFORE PROCEDURE).   MEDICATION INSTRUCTIONS  Unless otherwise instructed, you should take regular prescription medications with a small sip of water   as early as possible the morning of your procedure.  OTHER INSTRUCTIONS  You will need a responsible adult at least 75 years of age to accompany you and drive you home.   This person must remain in the waiting room during your procedure.  Wear loose fitting clothing that is easily removed.  Leave jewelry and other valuables at home.  However, you may wish to bring a book to read or  an iPod/MP3 player to listen to music as you wait for your procedure to start.  Remove all body piercing jewelry and leave at home.  Total time from sign-in until discharge is approximately 2-3 hours.  You should go home directly after your procedure and rest.  You can resume normal activities the  day after your procedure.  The day of your procedure you should not:   Drive   Make legal decisions   Operate machinery   Drink alcohol   Return to work  You will receive specific instructions about eating, activities and medications before you leave.    The above instructions have been reviewed and explained to me by   _______________________    I fully understand and  can verbalize these instructions _____________________________ Date _________

## 2010-07-23 NOTE — Progress Notes (Signed)
Summary: QUESTION FOLLOWUP COLONOSCOPY   Phone Note From Other Clinic   Summary of Call: Dr.Burchette called and asked that Dr.Perry please review colon and path. 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 Initial call taken by: Teryl Lucy RN,  April 26, 2009 10:51 AM  Follow-up for Phone Call        I have reviewed this patient's records.no routine followup was planned due to the patient's advanced age and multiple medical problems. If there are no clinical issues currently, I would not advocate routine followup. However, if there are clinical issues that are relevant, please let me know. Follow-up by: Hilarie Fredrickson MD,  April 26, 2009 12:59 PM  Additional Follow-up for Phone Call Additional follow up Details #1::        Flag sent to Dr.Burchette that DR.Perry's recommendation's for f/u  colonare now in pt's EMR document. Additional Follow-up by: Teryl Lucy RN,  April 26, 2009 2:18 PM

## 2010-07-23 NOTE — Procedures (Signed)
Summary: Colonoscopy  Patient: Gerald Dyer Note: All result statuses are Final unless otherwise noted.  Tests: (1) Colonoscopy (COL)   COL Colonoscopy           DONE     [facility]     [address]     [place], [place]  [address]           COLONOSCOPY PROCEDURE REPORT           PATIENT:  [name], [name]  MR#:  [id number]     BIRTHDATE:  [birth year], 84 yrs. old  GENDER:  male     ENDOSCOPIST:  Wilhemina Bonito. Eda Keys, MD     REF. BY:  Arlan Organ, M.D.     PROCEDURE DATE:  11/09/2009     PROCEDURE:  Colonoscopy with biopsy     ASA CLASS:  Class III     INDICATIONS:  iron deficiency anemia, heme positive stool, history     of colon cancer ; resection 2006     MEDICATIONS:   Fentanyl 50 mcg IV, Versed 5 mg IV           DESCRIPTION OF PROCEDURE:   After the risks benefits and     alternatives of the procedure were thoroughly explained, informed     consent was obtained.  Digital rectal exam was performed and     revealed no abnormalities.   The LB [id number] endoscope     was introduced through the anus and advanced to the hepatic     flexure, limited by an obstruction.    The quality of the prep was     excellent, using MoviPrep.  The instrument was then slowly     withdrawn as the colon was fully examined.     <<PROCEDUREIMAGES>>           FINDINGS:  A high grade partially obstructing mass was found in     the region of the hepatic flexure.The scope could not be passed     proximal to the lesion. Mult bx taken. There was a surgical     anastomosis in the sigmoid colon at 20cm.   Retroflexed views in     the rectum revealed internal hemorrhoids.    The scope was then     withdrawn from the patient and the procedure completed.           COMPLICATIONS:  None     ENDOSCOPIC IMPRESSION:     1) Mass at the hepatic flexure  c/w colon cancer     2) Anastomosis in the sigmoid colon     3) Internal hemorrhoids           RECOMMENDATIONS:     1) My office will  arrange for you to have a contrast CT scan of     abdomen and pelvis "right colon mass"     2) Surgical consultation           ______________________________     Wilhemina Bonito. Eda Keys, MD           CC:  Arlan Organ, MD; Evelena Peat, MD; Jimmye Norman, MD;The     Patient           n.     Rosalie DoctorWilhemina Bonito. Eda Keys at 11/09/2009 04:22 PM           Rich Brave, [id number]  Note: An exclamation mark (!) indicates a result that was not dispersed into  the flowsheet. Document Creation Date: 11/09/2009 4:23 PM _______________________________________________________________________  (1) Order result status: Final Collection or observation date-time: 11/09/2009 16:09 Requested date-time:  Receipt date-time:  Reported date-time:  Referring Physician:   Ordering Physician: Fransico Setters [id number]) Specimen Source:  Source: Launa Grill Order Number: [id number] Lab site:   Appended Document: Colonoscopy recall     Procedures Next Due Date:    Colonoscopy: 10/2010

## 2010-07-23 NOTE — Assessment & Plan Note (Signed)
Summary: CHEST CONGESTION/COUGH/SLEEPING ALOT/NJR   Vital Signs:  Patient profile:   77 year old male O2 Sat:      90 % on Room air Temp:     99.4 degrees F oral Pulse rate:   96 / minute Pulse rhythm:   regular BP sitting:   120 / 62  (left arm) Cuff size:   regular  Vitals Entered By: Sid Falcon LPN (April 18, 2010 4:25 PM)  O2 Flow:  Room air  History of Present Illness: patient seen with onset couple days ago of chest congestion and cough. Questionably productive. He has history of ?COPD. No definite fever. Seems more dyspneic with activity. Sister is caregiver and has noticed some yellow colored changes to sputum. No chest pain.  Recent pulmonary functions reviewed. No clear obstructive pattern. Patient remains on Symbicort.  Per pulm notes should have remained on Spiriva and held Symbicort but they reversed this.  Also using plain mucinex with little relief.  Recent loss of son.  Pt coping fairly well.  he has some dementia and has some definite memory defecits.   Allergies: 1)  ! 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) PLEURAL EFFUSION, LEFT (ICD-511.9) with LL Atx...........................Marland KitchenClance      - FOB 02/06/09 no abnormalities,  routine cultures neg PNEUMONIA, COMMUNITY ACQUIRED, PNEUMOCOCCAL (ICD-481) CHEST PAIN, PLEURITIC (ICD-786.52) HYPERTENSION (ICD-401.9) SUBDURAL HEMATOMA (ICD-432.1) ADENOCARCINOMA, COLON, HX OF (ICD-V10.05) DEPRESSION (ICD-311) GERD (ICD-530.81)    Social History: Last updated: 01/31/2009 pt is widowed. pt has children. pt is retired.  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 pt started at age 58.  1 ppd. quit 1985. PMH reviewed for relevance, SH/Risk Factors reviewed for  relevance  Review of Systems       The patient complains of dyspnea on exertion.  The patient denies anorexia, fever, weight loss, chest pain, syncope, peripheral edema, prolonged cough, headaches, hemoptysis, and abdominal pain.    Physical Exam  General:  patient is alert and cooperative somewhat hard of hearing Ears:  cerumen right canal. Left eardrum is normal Mouth:  moist and clear Neck:  No deformities, masses, or tenderness noted. Lungs:  diminished breath sounds in both bases. Scattered rhonchi. No wheezes Heart:  normal rate and regular rhythm.   Extremities:  no significant edema Neurologic:  alert & oriented X3.   Cervical Nodes:  No lymphadenopathy noted Psych:  normally interactive and good eye contact.     Impression & Recommendations:  Problem # 1:  COPD (ICD-496) Assessment Deteriorated  Exacerbation.  Pt at high risk of pneumonia.  Will start Avelox 400 mg by mouth once daily and prompt follow up if worsens. His updated medication list for this problem includes:    Albuterol Sulfate (2.5 Mg/29ml) 0.083% Nebu (Albuterol sulfate) ..... Every 6 hours as needed    Symbicort 160-4.5 Mcg/act Aero (Budesonide-formoterol fumarate) .[name]... 2 puffs two times a day    Spiriva Handihaler 18 Mcg Caps (Tiotropium bromide monohydrate) ..... One puff daily  Orders: Prescription Created Electronically [phone])  Problem # 2:  DEPRESSION (ICD-311)  His updated medication list for this problem includes:    Sertraline Hcl 100 Mg Tabs (Sertraline hcl) ..... One by mouth daily  Complete Medication List: 1)  Omeprazole 20 Mg Cpdr (Omeprazole) .... Once daily 2)  Sertraline  Hcl 100 Mg Tabs (Sertraline hcl) .... One by mouth daily 3)  Centrum Silver Ultra Mens Tabs (Multiple vitamins-minerals) .... Once daily 4)  Albuterol Sulfate (2.5 Mg/31ml) 0.083% Nebu (Albuterol sulfate) .... Every 6 hours as needed 5)  Ferrous Sulfate 325 (65 Fe) Mg Tabs (Ferrous sulfate) .... Bid 6)  Aspirin 81  Mg Tbec (Aspirin) .... Take 1 tablet by mouth once a day 7)  Symbicort 160-4.5 Mcg/act Aero (Budesonide-formoterol fumarate) .... 2 puffs two times a day 8)  Spiriva Handihaler 18 Mcg Caps (Tiotropium bromide monohydrate) .... One puff daily 9)  Donepezil Hcl 10 Mg Tabs (Donepezil hcl) .... One by mouth daily 10)  Avelox 400 Mg Tabs (Moxifloxacin hcl) .... One by mouth once daily for 10 days  Patient Instructions: 1)  Follow up promptly for any worsening shortness of breath or inability to keep down medications or fluids. 2)  Schedule followup for next Monday or Tuesday Prescriptions: AVELOX 400 MG TABS (MOXIFLOXACIN HCL) one by mouth once daily for 10 days  #10 x 0   Entered and Authorized by:   Evelena Peat MD   Signed by:   Evelena Peat MD on 04/18/2010   Method used:   Electronically to        [facility] [address] [phone]* (retail)       [address] [place], [place]  [address]       Ph: [phone]       Fax: [phone]   RxID:   [id number]    Orders Added: 1)  Est. Patient Level IV [03474] 2)  Prescription Created Electronically [phone]

## 2010-07-23 NOTE — Letter (Signed)
[facility]   Imported By: Maryln Gottron 10/25/2009 14:51:22  _____________________________________________________________________  External Attachment:    Type:   Image     Comment:   External Document

## 2010-07-23 NOTE — Progress Notes (Signed)
Summary: spiriva  Phone Note Call from Patient   Caller: Son-barry Call For: clance Summary of Call: waiting on refill of spiriva. [facility] [address] [place] cell [phone] Initial call taken by: Tivis Ringer, CNA,  May 02, 2010 12:46 PM  Follow-up for Phone Call        Rx for spiriva was sent to [facility] for pt to be made aware. Follow-up by: Vernie Murders,  May 02, 2010 1:48 PM    Prescriptions: SPIRIVA HANDIHALER 18 MCG CAPS (TIOTROPIUM BROMIDE MONOHYDRATE) one puff daily  #30 x 5   Entered by:   Vernie Murders   Authorized by:   Barbaraann Share MD   Signed by:   Vernie Murders on 05/02/2010   Method used:   Electronically to        [facility] [address] [phone]* (retail)       [address] [place], [place]  [address]       Ph: [phone]       Fax: [phone]   RxID:   [id number]

## 2010-07-23 NOTE — Progress Notes (Signed)
Summary: Call A Nurse   [facility]-A-Nurse Triage Call Report Triage Record Num: [id number] Operator: Remonia Richter Patient Name: Gerald Dyer Call Date & Time: 02/06/2010 8:16:22PM Patient Phone: [phone] PCP: Evelena Peat Patient Gender: Male PCP Fax : [phone] Patient DOB: 12/04/1924 [facility] Name[facility] Reason for Call: Son/Scott: went over to house to check on dad, noted weakness and not improving since discharge from hospital, intake not good today, has in home help and safe tonight, all emergent s/s ruled out,home care advise given per weakness guideline, son encouraged to call office in am for further advise Protocol(s) Used: Weakness or Paralysis Recommended Outcome per Protocol: See Provider within 72 Hours Reason for Outcome: All other situations Care Advice:  ~ Call provider if symptoms worsen or new symptoms develop.  ~ CAUTIONS

## 2010-07-23 NOTE — Assessment & Plan Note (Signed)
Summary: rov for LLL atx.   Copy to:  Gerald Dyer Primary Provider/Referring Provider:  Evelena Peat MD  CC:  Pt is here for a 6 month f/u appt.   Pt denied sob, cough, and nasal congestion or sore throat.  Pt states he will be needing surgery soon to remove a tumor in his colon.  Pt had recent CT of abd/pelvis.[name]  History of Present Illness: The pt comes in today for f/u of his chronic LLL atx probably secondary to mucus plugging and small pleural effusion after an episode of severe pna.  He has had FOB for airway examination, with no endobronchial process being found.  He comes in today for f/u where he is doing well.  He has minimal cough with no significant mucus production.  His breathing is stable.  He is scheduled for colon surgery for newly diagnosed cancer.  He has had a ct abd/pelvis recently which showed significant improvement in his LLL atx, with only residual scarring.  His pleural effusion has resolved.  Medications Prior to Update: 1)  Omeprazole 20 Mg Cpdr (Omeprazole) .... Once Daily As Needed 2)  Sertraline Hcl 100 Mg Tabs (Sertraline Hcl) .... One By Mouth Daily 3)  Centrum Silver Ultra Mens  Tabs (Multiple Vitamins-Minerals) .... Once Daily 4)  Feosol 200 (65 Fe) Mg Tabs (Ferrous Sulfate Dried) .... Once Daily 5)  Aricept 10 Mg Tabs (Donepezil Hcl) .... One By Mouth Once Daily  Allergies (verified): 1)  ! Codeine Sulfate (Codeine Sulfate)  Review of Systems  The patient denies shortness of breath with activity, shortness of breath at rest, productive cough, non-productive cough, coughing up blood, chest pain, irregular heartbeats, acid heartburn, indigestion, loss of appetite, weight change, abdominal pain, difficulty swallowing, sore throat, tooth/dental problems, headaches, nasal congestion/difficulty breathing through nose, sneezing, itching, ear ache, anxiety, depression, hand/feet swelling, joint stiffness or pain, rash, change in color of mucus, and fever.     Vital Signs:  Patient profile:   77 year old male Height:      72 inches Weight:      178 pounds BMI:     24.23 O2 Sat:      95 % on Room air Temp:     98.1 degrees F oral Pulse rate:   75 / minute BP sitting:   128 / 70  (left arm) Cuff size:   regular  Vitals Entered By: Arman Filter LPN (December 10, 2009 9:32 AM)  O2 Flow:  Room air CC: Pt is here for a 6 month f/u appt.   Pt denied sob, cough, nasal congestion or sore throat.  Pt states he will be needing surgery soon to remove a tumor in his colon.  Pt had recent CT of abd/pelvis. Comments Medications reviewed with patient Arman Filter LPN  December 10, 2009 9:32 AM    Physical Exam  General:  wd male in nad Lungs:  minimal decrease in bs in left base, otherwise totally clear. Heart:  rrr, no mrg Extremities:  no signficant edema or cyanosis Neurologic:  alert and oriented, moves all 4.   Impression & Recommendations:  Problem # 1:  PULMONARY COLLAPSE (ICD-518.0)   the pt is much improved clinically and by ct.  He has no endobronchial lesions by bronchoscopy, and improved aeration to his LLL by my review of his recent ct.  No further f/u recommended at this time.    Other Orders: Est. Patient Level III (84696)  Patient Instructions: 1)  no further f/u  needed. 2)  be sure and use incentive spirometer after surgery

## 2010-07-23 NOTE — Miscellaneous (Signed)
Summary: Ct. order  Clinical Lists Changes  Problems: Added new problem of NEOPLASM, DIGESTIVE SYSTEM (ICD-239.0) Orders: Added new Referral order of CT Abdomen/Pelvis with Contrast (CT Abd/Pelvis w/con) - Signed

## 2010-07-23 NOTE — Progress Notes (Signed)
Summary: returned call  Phone Note Call from Patient Call back at [phone]   Caller: Son//barry Call For: clance Summary of Call: Returning call. Initial call taken by: Darletta Moll,  April 04, 2010 4:49 PM  Follow-up for Phone Call        Spoke with pt's Son and notified of results/recs per [facility] append to PFT's.  [name] verbalized understanding and is aware pt needs to stay on spiriva and as needed albuterol and no symbicort for now. Follow-up by: Vernie Murders,  April 04, 2010 4:56 PM

## 2010-07-23 NOTE — Progress Notes (Signed)
Summary: Pt needs to get Colonoscopy sooner than 11/08/09  Phone Note Call from Patient Call back at [phone] cell   Caller: Son - Earvin Hansen Summary of Call: Pt cancer DR says that pt needs a colonoscopy before 11/08/09. Needs asap. Pls get sooner appt. Initial call taken by: Lucy Antigua,  October 19, 2009 11:29 AM  Follow-up for Phone Call        Will see if this can be moved up. Follow-up by: Evelena Peat MD,  October 19, 2009 12:26 PM  Additional Follow-up for Phone Call Additional follow up Details #1::        I SPOKE WITH [facility] AND SO FAR THERE ARE NO CANCELLATIONS WITH DR Marina Goodell AT THIS TIME. THEY WILL CALL PT IF ONE BECOMES AVAILABLE PRIOR TO HIS 11-08-2009 APPT. LMOAM FOR THE PT ABOUT THIS....Marland KitchenMarland KitchenWarnell Forester  Oct 23, 2009 8:27 AM

## 2010-07-23 NOTE — Assessment & Plan Note (Signed)
Summary: ROA/CB   Vital Signs:  Patient profile:   77 year old male Weight:      174 pounds Temp:     98 degrees F oral BP sitting:   122 / 62  (left arm) Cuff size:   regular  Vitals Entered By: Sid Falcon LPN (Oct 26, 270 4:58 PM) CC: one month office visit   History of Present Illness: Follow up dementia.  Initiated Aricept last visit.  On 10 mg dose and tolerating well. Accompanied by son who has seen some improvement in cognitive fxn.   No agitation.  Hx depression and symptoms stable on Zoloft.  Good appetite with steady weight gain.  Past hx of colon cancer.  Recent falling hgb and increased CEA.  Pt scheduled for f/u with GI.  Pt followed at [facility] and his cardiologist left.  Inquiring about names of others in that group.   Allergies: 1)  ! Codeine Sulfate (Codeine Sulfate)  Past History:  Past Medical History: Last updated: 01/31/2009 Anemia Osteoarthritis, kneew Cancer lower colon 2006 Stroke Subdural hematoma 2009 (right) COPD Previous Smoker PFO Hx CVA MEMORY LOSS (ICD-780.93) PLEURAL EFFUSION, LEFT (ICD-511.9) PNEUMONIA, COMMUNITY ACQUIRED, PNEUMOCOCCAL (ICD-481) CHEST PAIN, PLEURITIC (ICD-786.52) HYPERTENSION (ICD-401.9) SUBDURAL HEMATOMA (ICD-432.1) ADENOCARCINOMA, COLON, HX OF (ICD-V10.05) DEPRESSION (ICD-311) GERD (ICD-530.81)    Review of Systems  The patient denies anorexia, fever, weight loss, chest pain, headaches, abdominal pain, melena, hematochezia, and severe indigestion/heartburn.    Physical Exam  General:  Well-developed,well-nourished,in no acute distress; alert,appropriate and cooperative throughout examination Neck:  No deformities, masses, or tenderness noted. Lungs:  Normal respiratory effort, chest expands symmetrically. Lungs are clear to auscultation, no crackles or wheezes. Heart:  normal rate and regular rhythm.   Neurologic:  cranial nerves II-XII intact and strength normal in all extremities.   Psych:   normally interactive, good eye contact, not anxious appearing, and not depressed appearing.  oriented to place, day of week, but not month or year.    Impression & Recommendations:  Problem # 1:  MEMORY LOSS (ICD-780.93) Assessment Improved  Complete Medication List: 1)  Omeprazole 20 Mg Cpdr (Omeprazole) .... Once daily as needed 2)  Sertraline Hcl 100 Mg Tabs (Sertraline hcl) .... One by mouth daily 3)  Centrum Silver Ultra Mens Tabs (Multiple vitamins-minerals) .... Once daily 4)  Feosol 200 (65 Fe) Mg Tabs (Ferrous sulfate dried) .... Once daily 5)  Aricept 5 Mg Tabs (Donepezil hcl) .... One by mouth once daily for one month then titrate to 10 mg dose. 6)  Aricept 10 Mg Tabs (Donepezil hcl) .... One by mouth once daily  Patient Instructions: 1)  Please schedule a follow-up appointment in 4 months .  2)  Cardiologist - Dr Daphene Jaeger Prescriptions: ARICEPT 5 MG TABS (DONEPEZIL HCL) one by mouth once daily for one month then titrate to 10 mg dose.  #30 x 11   Entered and Authorized by:   Evelena Peat MD   Signed by:   Evelena Peat MD on 10/25/2009   Method used:   Electronically to        [facility]* (retail)       [address]       [place], [place]  [address]       Ph: [phone] or [phone]       Fax: [phone]   RxID:   [id number]

## 2010-07-23 NOTE — Letter (Signed)
Summary: [facility]  [facility]   Imported By: Sherian Rein 04/22/2010 11:54:30  _____________________________________________________________________  External Attachment:    Type:   Image     Comment:   External Document

## 2010-07-25 NOTE — Letter (Signed)
Summary: [facility]  [facility]   Imported By: Maryln Gottron 06/07/2010 13:44:22  _____________________________________________________________________  External Attachment:    Type:   Image     Comment:   External Document

## 2010-07-25 NOTE — Assessment & Plan Note (Signed)
Summary: 1 month rov/njr   Vital Signs:  Patient profile:   77 year old male Weight:      192 pounds O2 Sat:      91 % Temp:     97.7 degrees F Pulse rate:   95 / minute BP sitting:   130 / 76  (left arm)  Vitals Entered By: Pura Spice, RN (May 31, 2010 1:10 PM) CC: 3 mth follow up cough congestion   History of Present Illness: Patient seen for followup. He has history of dementia with recent insomnia issues. Still taking some daytime naps. Son states that he is sleeping much better with lorazepam at low dosage. Depression is stable on sertraline.  He has history of COPD and prior history of left pleural effusion. Recent dry cough past few days without fever.  He is compliant with Spiriva use.  Family helping supervise his meds.  Allergies: 1)  ! 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) PLEURAL EFFUSION, LEFT (ICD-511.9) with LL Atx...........................Marland KitchenClance      - FOB 02/06/09 no abnormalities,  routine cultures neg PNEUMONIA, COMMUNITY ACQUIRED, PNEUMOCOCCAL (ICD-481) CHEST PAIN, PLEURITIC (ICD-786.52) HYPERTENSION (ICD-401.9) SUBDURAL HEMATOMA (ICD-432.1) ADENOCARCINOMA, COLON, HX OF (ICD-V10.05) DEPRESSION (ICD-311) GERD (ICD-530.81)    Social History: Last updated: 05/31/2010 pt is widowed. pt has children. pt is retired.  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 pt started at age 49.  1 ppd. quit 1985. Death of son Lorin Picket) 31-Mar-2023 and he had been primary care giver to pt.  Social History: pt is widowed. pt has children. pt is retired.  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 pt started at age 81.  1 ppd. quit 1985. Death of son Lorin Picket) 2023/03/31 and he had  been primary care giver to pt.  Physical Exam  General:  Well-developed,well-nourished,in no acute distress; alert,appropriate and cooperative throughout examination Ears:  External ear exam shows no significant lesions or deformities.  Otoscopic examination reveals clear canals, tympanic membranes are intact bilaterally without bulging, retraction, inflammation or discharge. Hearing is grossly normal bilaterally. Neck:  No deformities, masses, or tenderness noted. Lungs:   decreased breath sounds left base compared to right. No wheezes Heart:  normal rate and regular rhythm.   Extremities:  no edema   Impression & Recommendations:  Problem # 1:  INSOMNIA (ICD-780.52) Assessment Improved  Problem # 2:  COPD (ICD-496) has received flu vaccine and PVX up to date. The following medications were removed from the medication list:    Symbicort 160-4.5 Mcg/act Aero (Budesonide-formoterol fumarate) .[name]... 2 puffs two times a day His updated medication list for this problem includes:    Albuterol Sulfate (2.5 Mg/42ml) 0.083% Nebu (Albuterol sulfate) ..... Every 6 hours as needed    Spiriva Handihaler 18 Mcg Caps (Tiotropium bromide monohydrate) ..... One puff daily  Complete Medication List: 1)  Omeprazole 20 Mg Cpdr (Omeprazole) .... Once daily 2)  Sertraline Hcl 100 Mg Tabs (Sertraline hcl) .... One by mouth daily 3)  Centrum Silver Ultra Mens Tabs (Multiple vitamins-minerals) .... Once daily 4)  Albuterol Sulfate (2.5 Mg/56ml) 0.083% Nebu (Albuterol sulfate) .... Every 6 hours as needed 5)  Ferrous Sulfate 325 (65 Fe) Mg Tabs (Ferrous sulfate) .... Bid 6)  Aspirin 81 Mg Tbec (  Aspirin) .... Take 1 tablet by mouth once a day 7)  Spiriva Handihaler 18 Mcg Caps (Tiotropium bromide monohydrate) .... One puff daily 8)  Donepezil Hcl 10 Mg Tabs (Donepezil hcl) .... One by mouth daily 9)  Lorazepam 0.5 Mg Tabs (Lorazepam) .... One by mouth at bedtime  Patient Instructions: 1)  Please schedule a  follow-up appointment in 6 months .    Orders Added: 1)  Est. Patient Level III [21308]

## 2010-09-05 ENCOUNTER — Encounter: Payer: Self-pay | Admitting: Pulmonary Disease

## 2010-09-05 ENCOUNTER — Ambulatory Visit (INDEPENDENT_AMBULATORY_CARE_PROVIDER_SITE_OTHER): Payer: Medicare Other | Admitting: Pulmonary Disease

## 2010-09-05 DIAGNOSIS — J449 Chronic obstructive pulmonary disease, unspecified: Secondary | ICD-10-CM

## 2010-09-05 DIAGNOSIS — J4489 Other specified chronic obstructive pulmonary disease: Secondary | ICD-10-CM

## 2010-09-06 LAB — CBC
HCT: 27.5 % — ABNORMAL LOW (ref 39.0–52.0)
HCT: 28.3 % — ABNORMAL LOW (ref 39.0–52.0)
HCT: 30.5 % — ABNORMAL LOW (ref 39.0–52.0)
Hemoglobin: 9.3 g/dL — ABNORMAL LOW (ref 13.0–17.0)
MCH: 27.4 pg (ref 26.0–34.0)
MCH: 27.6 pg (ref 26.0–34.0)
MCHC: 32.8 g/dL (ref 30.0–36.0)
MCV: 84.2 fL (ref 78.0–100.0)
MCV: 84.7 fL (ref 78.0–100.0)
MCV: 85.7 fL (ref 78.0–100.0)
MCV: 87 fL (ref 78.0–100.0)
Platelets: 299 10*3/uL (ref 150–400)
RBC: 3.21 MIL/uL — ABNORMAL LOW (ref 4.22–5.81)
RDW: 13.5 % (ref 11.5–15.5)
RDW: 13.8 % (ref 11.5–15.5)
RDW: 13.9 % (ref 11.5–15.5)
RDW: 14.2 % (ref 11.5–15.5)
WBC: 11.3 10*3/uL — ABNORMAL HIGH (ref 4.0–10.5)
WBC: 7.9 10*3/uL (ref 4.0–10.5)
WBC: 8.1 10*3/uL (ref 4.0–10.5)

## 2010-09-06 LAB — DIFFERENTIAL
Basophils Absolute: 0 10*3/uL (ref 0.0–0.1)
Basophils Absolute: 0.1 10*3/uL (ref 0.0–0.1)
Eosinophils Absolute: 0.1 10*3/uL (ref 0.0–0.7)
Eosinophils Absolute: 0.2 10*3/uL (ref 0.0–0.7)
Eosinophils Relative: 1 % (ref 0–5)
Eosinophils Relative: 1 % (ref 0–5)
Eosinophils Relative: 2 % (ref 0–5)
Lymphocytes Relative: 11 % — ABNORMAL LOW (ref 12–46)
Lymphocytes Relative: 8 % — ABNORMAL LOW (ref 12–46)
Lymphs Abs: 0.9 10*3/uL (ref 0.7–4.0)
Lymphs Abs: 1 10*3/uL (ref 0.7–4.0)
Monocytes Absolute: 0.8 10*3/uL (ref 0.1–1.0)
Monocytes Absolute: 1.1 10*3/uL — ABNORMAL HIGH (ref 0.1–1.0)

## 2010-09-06 LAB — BASIC METABOLIC PANEL
BUN: 11 mg/dL (ref 6–23)
BUN: 12 mg/dL (ref 6–23)
BUN: 13 mg/dL (ref 6–23)
BUN: 21 mg/dL (ref 6–23)
CO2: 25 mEq/L (ref 19–32)
CO2: 25 mEq/L (ref 19–32)
Chloride: 105 mEq/L (ref 96–112)
Chloride: 105 mEq/L (ref 96–112)
Chloride: 107 mEq/L (ref 96–112)
Chloride: 108 mEq/L (ref 96–112)
Creatinine, Ser: 1.38 mg/dL (ref 0.4–1.5)
Glucose, Bld: 124 mg/dL — ABNORMAL HIGH (ref 70–99)
Potassium: 3.4 mEq/L — ABNORMAL LOW (ref 3.5–5.1)
Potassium: 3.6 mEq/L (ref 3.5–5.1)
Potassium: 3.7 mEq/L (ref 3.5–5.1)

## 2010-09-06 LAB — URINALYSIS, MICROSCOPIC ONLY
Bilirubin Urine: NEGATIVE
Hgb urine dipstick: NEGATIVE
Ketones, ur: NEGATIVE mg/dL
Protein, ur: NEGATIVE mg/dL
Urobilinogen, UA: 0.2 mg/dL (ref 0.0–1.0)

## 2010-09-06 LAB — RENAL FUNCTION PANEL
Albumin: 2.3 g/dL — ABNORMAL LOW (ref 3.5–5.2)
Calcium: 8.3 mg/dL — ABNORMAL LOW (ref 8.4–10.5)
Creatinine, Ser: 1.44 mg/dL (ref 0.4–1.5)
GFR calc Af Amer: 57 mL/min — ABNORMAL LOW (ref >60)
GFR calc non Af Amer: 47 mL/min — ABNORMAL LOW (ref >60)

## 2010-09-06 LAB — ANTI-NEUTROPHIL ANTIBODY

## 2010-09-06 LAB — CULTURE, BLOOD (ROUTINE X 2)
Culture: NO GROWTH
Culture: NO GROWTH

## 2010-09-06 LAB — EXPECTORATED SPUTUM ASSESSMENT W GRAM STAIN, RFLX TO RESP C

## 2010-09-06 LAB — CULTURE, RESPIRATORY W GRAM STAIN

## 2010-09-06 LAB — MPO/PR-3 (ANCA) ANTIBODIES

## 2010-09-07 LAB — DIFFERENTIAL
Basophils Relative: 0 % (ref 0–1)
Eosinophils Absolute: 0.2 10*3/uL (ref 0.0–0.7)
Eosinophils Relative: 2 % (ref 0–5)
Lymphs Abs: 1.5 10*3/uL (ref 0.7–4.0)
Monocytes Absolute: 0.5 10*3/uL (ref 0.1–1.0)
Monocytes Relative: 6 % (ref 3–12)
Neutrophils Relative %: 70 % (ref 43–77)

## 2010-09-07 LAB — BLOOD GAS, ARTERIAL
Drawn by: 275531
O2 Content: 3.5 L/min
Patient temperature: 98.6
pCO2 arterial: 31.1 mmHg — ABNORMAL LOW (ref 35.0–45.0)
pH, Arterial: 7.443 (ref 7.350–7.450)

## 2010-09-07 LAB — COMPREHENSIVE METABOLIC PANEL
ALT: 15 U/L (ref 0–53)
AST: 20 U/L (ref 0–37)
Albumin: 3.4 g/dL — ABNORMAL LOW (ref 3.5–5.2)
Alkaline Phosphatase: 115 U/L (ref 39–117)
GFR calc Af Amer: >60 mL/min (ref >60)
Glucose, Bld: 92 mg/dL (ref 70–99)
Potassium: 3.9 mEq/L (ref 3.5–5.1)
Sodium: 140 mEq/L (ref 135–145)
Total Protein: 6.7 g/dL (ref 6.0–8.3)

## 2010-09-07 LAB — TYPE AND SCREEN
ABO/RH(D): O POS
Antibody Screen: NEGATIVE
Donor AG Type: NEGATIVE

## 2010-09-07 LAB — BASIC METABOLIC PANEL
BUN: 7 mg/dL (ref 6–23)
BUN: 8 mg/dL (ref 6–23)
CO2: 26 mEq/L (ref 19–32)
Calcium: 8.7 mg/dL (ref 8.4–10.5)
Calcium: 8.9 mg/dL (ref 8.4–10.5)
Chloride: 104 mEq/L (ref 96–112)
Creatinine, Ser: 1.18 mg/dL (ref 0.4–1.5)
Creatinine, Ser: 1.3 mg/dL (ref 0.4–1.5)
GFR calc Af Amer: >60 mL/min (ref >60)
GFR calc Af Amer: >60 mL/min (ref >60)
GFR calc non Af Amer: 59 mL/min — ABNORMAL LOW (ref >60)

## 2010-09-07 LAB — SURGICAL PCR SCREEN: MRSA, PCR: NEGATIVE

## 2010-09-07 LAB — CBC
HCT: 36.2 % — ABNORMAL LOW (ref 39.0–52.0)
MCH: 28.8 pg (ref 26.0–34.0)
MCV: 86.5 fL (ref 78.0–100.0)
MCV: 87.7 fL (ref 78.0–100.0)
Platelets: 177 10*3/uL (ref 150–400)
Platelets: 178 10*3/uL (ref 150–400)
Platelets: 203 10*3/uL (ref 150–400)
RBC: 3.48 MIL/uL — ABNORMAL LOW (ref 4.22–5.81)
RBC: 3.5 MIL/uL — ABNORMAL LOW (ref 4.22–5.81)
RBC: 4.13 MIL/uL — ABNORMAL LOW (ref 4.22–5.81)
RDW: 14 % (ref 11.5–15.5)
RDW: 14 % (ref 11.5–15.5)
WBC: 10.6 10*3/uL — ABNORMAL HIGH (ref 4.0–10.5)
WBC: 7.1 10*3/uL (ref 4.0–10.5)

## 2010-09-09 ENCOUNTER — Other Ambulatory Visit: Payer: Self-pay | Admitting: Family Medicine

## 2010-09-09 ENCOUNTER — Ambulatory Visit: Payer: Self-pay | Admitting: Pulmonary Disease

## 2010-09-10 NOTE — Assessment & Plan Note (Signed)
Summary: rov for emphysema   Copy to:  Evelena Peat Primary Provider/Referring Provider:  Evelena Peat MD  CC:  6 month f/u appt for emphysema.  Pt states breathing is good- denies sob, cough, and sore throat or fever. [name]  History of Present Illness: the pt comes in today for f/u of his presumed copd.  He was unable to do pfts adequately, but his history is very suggestive of this diagnosis.  He has been maintaining on spiriva, and feels he is doing well.  He denies any congestion or recent chest infection, and feels his exertional tolerance is at baseline.   Current Medications (verified): 1)  Omeprazole 20 Mg Cpdr (Omeprazole) .... Once Daily 2)  Sertraline Hcl 100 Mg Tabs (Sertraline Hcl) .... One By Mouth Daily 3)  Centrum Silver Ultra Mens  Tabs (Multiple Vitamins-Minerals) .... Once Daily 4)  Albuterol Sulfate (2.5 Mg/36ml) 0.083% Nebu (Albuterol Sulfate) .... Every 6 Hours As Needed 5)  Ferrous Sulfate 325 (65 Fe) Mg Tabs (Ferrous Sulfate) .... Once Daily 6)  Aspirin 81 Mg Tbec (Aspirin) .... Take 1 Tablet By Mouth Once A Day 7)  Spiriva Handihaler 18 Mcg Caps (Tiotropium Bromide Monohydrate) .... One Puff Daily 8)  Donepezil Hcl 10 Mg Tabs (Donepezil Hcl) .... One By Mouth Daily 9)  Lorazepam 0.5 Mg Tabs (Lorazepam) .... One By Mouth At Bedtime 10)  Mucinex Dm Maximum Strength 60-1200 Mg Xr12h-Tab (Dextromethorphan-Guaifenesin) .... As Needed  Allergies (verified): 1)  ! Codeine Sulfate (Codeine Sulfate)  Review of Systems       The patient complains of joint stiffness or pain.  The patient denies shortness of breath with activity, shortness of breath at rest, productive cough, non-productive cough, coughing up blood, chest pain, irregular heartbeats, acid heartburn, indigestion, loss of appetite, weight change, abdominal pain, difficulty swallowing, sore throat, tooth/dental problems, headaches, nasal congestion/difficulty breathing through nose, sneezing, itching, ear ache,  anxiety, depression, hand/feet swelling, rash, change in color of mucus, and fever.    Vital Signs:  Patient profile:   77 year old male Height:      72 inches Weight:      203.50 pounds O2 Sat:      94 % on Room air Temp:     98.1 degrees F oral Pulse rate:   93 / minute BP sitting:   132 / 60  (right arm) Cuff size:   regular  Vitals Entered By: Arman Filter LPN (September 05, 2010 10:04 AM)  O2 Flow:  Room air CC: 6 month f/u appt for emphysema.  Pt states breathing is good- denies sob, cough, sore throat or fever.  Comments Medications reviewed with patient Arman Filter LPN  September 05, 2010 10:04 AM    Physical Exam  General:  wd male in nad  Lungs:  decreased bs, with a few scattered crackles no wheezing noted.  Heart:  rrr, no mrg  Extremities:  no significant edema or cyanosis  Neurologic:  alert and oriented, moves all 4    Impression & Recommendations:  Problem # 1:  COPD (ICD-496) the pt has presumed copd by his history, and unable to do pfts.  He seems to be maintaining a stable baseline on his current meds, and therefore will keep him on this.  I have asked him to stay as active as possible.    Medications Added to Medication List This Visit: 1)  Ferrous Sulfate 325 (65 Fe) Mg Tabs (Ferrous sulfate) .... Once daily 2)  Mucinex Dm Maximum Strength  60-1200 Mg Xr12h-tab (Dextromethorphan-guaifenesin) .... As needed  Other Orders: Est. Patient Level III (46962)  Patient Instructions: 1)  stay on spiriva 2)  stay as active as you can 3)  followup with me in one year, but call if having breathing issues.

## 2010-09-26 ENCOUNTER — Other Ambulatory Visit: Payer: Self-pay | Admitting: Family

## 2010-09-26 ENCOUNTER — Encounter (HOSPITAL_BASED_OUTPATIENT_CLINIC_OR_DEPARTMENT_OTHER): Payer: Medicare Other | Admitting: Hematology & Oncology

## 2010-09-26 DIAGNOSIS — C187 Malignant neoplasm of sigmoid colon: Secondary | ICD-10-CM

## 2010-09-26 DIAGNOSIS — F0390 Unspecified dementia without behavioral disturbance: Secondary | ICD-10-CM

## 2010-09-26 LAB — COMPREHENSIVE METABOLIC PANEL
Albumin: 4 g/dL (ref 3.5–5.2)
Alkaline Phosphatase: 111 U/L (ref 39–117)
BUN: 17 mg/dL (ref 6–23)
CO2: 25 mEq/L (ref 19–32)
Calcium: 9.4 mg/dL (ref 8.4–10.5)
Chloride: 107 mEq/L (ref 96–112)
Sodium: 141 mEq/L (ref 135–145)

## 2010-09-26 LAB — CEA: CEA: 1.2 ng/mL (ref 0.0–5.0)

## 2010-09-26 LAB — CBC WITH DIFFERENTIAL (CANCER CENTER ONLY)
BASO%: 0.3 % (ref 0.0–2.0)
EOS%: 2.6 % (ref 0.0–7.0)
HCT: 45 % (ref 38.7–49.9)
LYMPH#: 1.9 10*3/uL (ref 0.9–3.3)
LYMPH%: 25 % (ref 14.0–48.0)
MCHC: 34 g/dL (ref 32.0–35.9)
MONO#: 0.5 10*3/uL (ref 0.1–0.9)
NEUT%: 64.9 % (ref 40.0–80.0)
Platelets: 174 10*3/uL (ref 145–400)
RDW: 14.5 % (ref 11.1–15.7)
WBC: 7.4 10*3/uL (ref 4.0–10.0)

## 2010-09-28 LAB — CULTURE, RESPIRATORY W GRAM STAIN

## 2010-09-30 ENCOUNTER — Telehealth: Payer: Self-pay | Admitting: *Deleted

## 2010-09-30 NOTE — Telephone Encounter (Signed)
Pt needs handicapped sticker sent to his son at fax number:  [phone]:  Att: Signa Kell (Son).

## 2010-09-30 NOTE — Telephone Encounter (Signed)
OK to send.

## 2010-09-30 NOTE — Telephone Encounter (Signed)
Handicap sticker application signed by Dr Caryl Never, faxed to son, confirmation received

## 2010-10-29 ENCOUNTER — Other Ambulatory Visit: Payer: Self-pay | Admitting: Pulmonary Disease

## 2010-11-05 NOTE — Op Note (Signed)
NAME:  JUVON, TEATER NO.:  [id number]   MEDICAL RECORD NO.:  [id number]          PATIENT TYPE:  INP   LOCATION:  3018                         FACILITY:  [facility]   PHYSICIAN:  Coletta Memos, M.D.     DATE OF BIRTH:  [birth year]   DATE OF PROCEDURE:  02/09/2008  DATE OF DISCHARGE:  02/22/2008                               OPERATIVE REPORT   PREOPERATIVE DIAGNOSIS:  Recurrent subdural hematoma, right side.   POSTOPERATIVE DIAGNOSIS:  Recurrent subdural hematoma, right side.   PROCEDURE:  1. Right frontal temporal parietal redo craniectomy for hematoma      evacuation.  2. Placement of craniotomy flap and right upper quadrant      subcutaneously.   SURGEON:  Coletta Memos, MD   ANESTHESIA:  General endotracheal.   INDICATIONS:  Mr. Holtsclaw is a gentleman who underwent his initial  operation on February 04, 2008.  He had done well postoperatively, but had  some dwindling in his level of consciousness.  He was slightly more  plegic than he had been immediately after that operation.  Head CT  showed some blood, which had reaccumulated on the side of the operation.  I felt that redo craniotomy will be in his best interests and explained  that to the family.  They have agreed and he is taken to the operating  room today.   OPERATIVE NOTE:  Mr. Crevier was taken to the operating room and placed  under general anesthesia without difficulty.  He was positioned with his  head turned towards the left on a donut.  His head was prepped and he  was draped in a sterile fashion.  I removed the staples, which had been  in place.  I then opened the old incision and reflected the scalp flap  anteriorly holding it in place with towel clips secured to the drape or  rubber bands.  I then removed the bone flap by removing the screws along  the edge of the skull.  The bone flap was removed without difficulty.  I  then removed what was a layer of epidural blood.  I placed central  tack-  ups and peripheral tack-ups all around.  There was frankly more blood  than I would have expected postoperatively, but that was removed without  great difficulty.  I then reopened the dura and removed some more blood.  I also coagulated the membranes which had formed along the inner dural  margin.  I did not in all honesty see that much blood which had  reaccumulated directly in the subdural space, but I removed what was  there.  I then reapproximated the dural edge and then reapproximated the  scalp using Vicryls in the subgaleal layer and 3-0 nylon for the scalp.  I then turned my attention to the abdomen, which had been prepped and  draped at the same time of his head.  In the right upper quadrant, I  made an incision and developed a pocket subcutaneously beneath the  costal margin on the right side.  I made this large enough to  accommodate the craniotomy flap.  I removed all of the plates and screws  prior to placing it there.  I  placed this to skull and had subcutaneous pocket, then closed that  pocket using Vicryl sutures to reapproximate subcutaneous and  subcuticular layers.  Staples were used on the skin edges.  Sterile  dressings were applied there into the cranial incision.  He was kept  intubated, taken back to the Neurosurgical Intensive Care Unit, doing  well.           ______________________________  Coletta Memos, M.D.     KC/MEDQ  D:  02/22/2008  T:  02/23/2008  Job:  [id number]

## 2010-11-05 NOTE — H&P (Signed)
NAME:  Gerald Dyer, Gerald Dyer NO.:  [id number]   MEDICAL RECORD NO.:  [id number]          PATIENT TYPE:  IPS   LOCATION:  4030                         FACILITY:  [facility]   PHYSICIAN:  Ranelle Oyster, M.D.DATE OF BIRTH:  12-06-1924   DATE OF ADMISSION:  02/22/2008  DATE OF DISCHARGE:                              HISTORY & PHYSICAL   CHIEF COMPLAINT:  Left-sided weakness and altered mental status.   HISTORY OF PRESENT ILLNESS:  This is an 77 year old white male with  history of stroke in 2006, with little residual as well as CAD status  post aortic valve replacement in 2007.  He was admitted on February 04, 2008, after a fall 2-3 weeks prior.  He did have left-sided weakness and  altered mental status.  MRI is positive for a right subacute  frontotemporal parietal subdural hemorrhage.  The patient underwent a  craniotomy for evacuation on February 05, 2008, by Dr. Franky Macho.  He was  placed on Keppra for seizure prophylaxis.  He had bouts of confusion and  restraints for use initially.  The patient had a followup head CT on  February 09, 2008, which revealed recurrent subdural blood and the patient  underwent evacuation and craniectomy.  Modified barium swallow on February 14, 2008, revealed dysphagia and the patient was cleared for D2 nectar-  thick liquid diet.  Blood pressures have been followed and he has had  some hypertension that times.  He is on lisinopril and Toprol-XL  currently.  The patient continues to struggle with mobility and balance  and was admitted to the inpatient rehab unit today to manage his medical  and physical needs.   REVIEW OF SYSTEMS:  Notable for depression.  Denies headache.  The  patient does feel a bit lightheaded on examination today.  Full reviews  in the written H&P and other pertinent positives are above.   PAST MEDICAL HISTORY:  1. Positive right CVA in 2006, little residual.  2. COPD.  3. Colon cancer.  4. Hypertension.  5.  Depression.  6. CAD, status post aortic valve replacement in 2007.  7. Hard-of-hearing.  8. Cholecystectomy.   He has remote tobacco history and does not drink.   Family history is positive for CAD.   SOCIAL HISTORY:  The patient is widowed, retired, and lives alone.  Son  plans to hire assist as needed at home.  They have one-level house with  two steps to enter.   FUNCTIONAL HISTORY:  The patient was independent prior to arrival.  Currently, he is requiring min to mod assist for basic gait and mod  assist for transfers, and min to mod assist for ADLs.   ALLERGIES:  CODEINE.   HOME MEDICATIONS:  1. Lisinopril 20 mg daily.  2. Toprol 25 mg daily.  3. Zoloft 50 mg daily.  4. Multivitamin daily.  5. Aspirin 325 mg daily.  6. Combivent inhaler q.i.d. p.r.n.  7. Iron 65 mg daily.  8. Omeprazole 20 mg daily.  9. Celebrex 200 mg daily.   Last lab work was on February 12, 2008, BUN and creatinine 17 and 0.7,  potassium 3.3, and sodium 142.  Hemoglobin 9.1, white count 6.9, and  platelets 252,000.   PHYSICAL EXAMINATION:  Blood pressures 90/49 with recheck in the bedside  today 76 over high 40s, pulse 70, and respiratory rate 16.  His  temperature is 97.1.  The patient is lying in bed generally alert.  Pupils are equal, round, and reactive to light.  Fabio Asa is notable for  right craniectomy area, which is clean, dry, and intact.  Flap is in the  stomach.  Ear, nose, and throat exam is notable for decreased hearing.  The patient is edentulous with teeth at bedside.  Neck is supple without  JVD or lymphadenopathy.  Chest is clear to auscultation bilaterally  without wheezes, rales, or rhonchi.  Heart has regular rate and rhythm  without murmur, rubs, or gallops today.  Abdomen is soft and nontender.  Bowel sounds are positive.  Skin generally intact except for surgical  sites, which are well approximated and clean.  Neurologically cranial  nerves II through XII reveal decreased  hearing.  The patient is  dysarthric due to the dentures being out.  Sensation is intact pinprick  and light touch in all for.  Motor exam is notable for 3+/5 strength in  left upper extremity and 4+/5 strength in left lower extremity.  Right-  sided upper extremity is 4-4+/5 and is 4/5 right leg today.  He lacks  coordination, particularly in the left side with finger-to-nose  movements.  He has hard time manipulating any objects with the left hand  today.  He is a bit better with the left leg.  Judgment was fair,  although the patient lacks insight and awareness.  He is oriented to  place, reason he is here, date, and name.  Memory is fair for short-term  and information.  Attention is poor.  Mood is flat.   ASSESSMENT/PLAN:  1. Functional deficits secondary to right frontotemporal parietal      subdural hemorrhage after a fall.  The patient is status post      subdural evacuation and then repeat surgery with craniectomy on      February 09, 2008.  The patient is admitted to receive collaborative      interdisciplinary care between the physiatrist, rehab nursing      staff, and therapy team.  PT will assess and treat for safety,      balance, left lower extremity weakness, safety awareness, and      appropriate equipment.  OT will assess and treat for upper      extremity use, ADL training, appropriate safety, family education,      and equipment as needed. 24 hr Rehab nurse will follow on a      continued basis for nutrition, wound care needs, bowel and bladder      continence, and safety.  The patient may benefit from helmet to      protect the cranium.  Speech language pathology will assess and      treat for language and cognitive skills.  Also, they will follow      for dysphagia.  Rehab case manager/social worker assess for      psychosocial needs and discharge planning.  Team conference will be      held weekly to assess progress, establish goals, and to determine      barriers  to discharge.  Rehab goals are supervision with mobility      and self-care.  Estimated length of stay is 10-14  days.  Prognosis      fair to good.  2. Seizure prophylaxis, Keppra 500 mg b.i.d.  3. Hypertension:  We will hold lisinopril and Toprol today.  Give IV      fluid tonight and check stat labs to ensure he is properly hydrated      and to follow up blood count.  Heart rate was regular on exam      today.  4. Mood:  Continue Zoloft for now.  5. Pain control.  Maintain on Celebrex and Vicodin, observe.  6. Coronary artery disease:  The patient's aspirin has been held      secondary to subdural.  We will likely be able to resume soon.  7. Chronic obstructive pulmonary disease:  Maintain Combivent inhaler      p.r.n.  Lungs were clear on exam today.  8. Anemia:  Iron supplementation.  Check admission hemoglobin.  9. Deep venous thrombosis prophylaxis, thigh-high TEDs and ambulation.      Ranelle Oyster, M.D.  Electronically Signed     ZTS/MEDQ  D:  02/22/2008  T:  02/23/2008  Job:  [id number]

## 2010-11-05 NOTE — Discharge Summary (Signed)
NAME:  Gerald Dyer, Gerald Dyer NO.:  [id number]   MEDICAL RECORD NO.:  [id number]          PATIENT TYPE:  INP   LOCATION:  3018                         FACILITY:  [facility]   PHYSICIAN:  Coletta Memos, M.D.     DATE OF BIRTH:  03/12/1925   DATE OF ADMISSION:  02/04/2008  DATE OF DISCHARGE:  02/22/2008                               DISCHARGE SUMMARY   ADMITTING DIAGNOSIS:  Right chronic panhemispheric subdural hematoma.   DISCHARGE DIAGNOSIS:  Right chronic panhemispheric subdural hematoma.   PROCEDURES:  1. Right frontotemporal parietal craniotomy for subdural hematoma      evacuation performed on February 04, 2008.  2. Redo craniotomy for subdural hematoma recurrence with skull      removal.   Complications after second operation none.  After first operation,  recurrence of the subdural.   Postoperatively, Gerald Dyer has done quite well.  His status is  improved.  He still showed almost what appears to be a neglect of his  left side, but his strength is actually fairly good there.  He still has  what would be a very slight hemiplegia, but he is able to move, walk,  and use that arm for anything that he needs to use it for.  Speech is  clear and fluent.  He is alert and oriented by 4 at discharge.  His  wound is clean and dry.  No signs of infection on the head nor there any  signs of infection on the abdominal incision.  He is tolerating a  dysphagia diet.  He will be discharge to the Rehabilitation Service at  [facility].   DISCHARGE STATUS:  Alive, well, and improving.   MEDICATIONS:  He will just need to stay on his Keppra for a period of  time, but outside of that, no other meds from my standpoint.   I will plan on seeing him back in approximately 1 month for replacement  of the bone flap.           ______________________________  Coletta Memos, M.D.     KC/MEDQ  D:  02/22/2008  T:  02/23/2008  Job:  [id number]

## 2010-11-05 NOTE — Assessment & Plan Note (Signed)
[name] returns to clinic today accompanied by his son.  The  patient is an 77 year old male with history of a prior stroke in 2006,  with little residual deficits.  He also had a aortic valve replacement  in 2007.   The patient was admitted on February 04, 2008, after a fall 2-3 weeks  prior to admission with noted left-sided weakness and altered mental  status.  MRI study showed a right subacute frontoparietal subdural  hematoma.  The patient underwent craniotomy for evacuation of the  subdural hematoma on February 05, 2008, by Dr. Franky Macho.  He is placed on  Keppra postoperatively for seizure prophylaxis.  He had bouts of  confusion and restraints were necessary.  Followup cranial CT done on  February 09, 2008, showed recurrent subdural hematoma with evacuation of  hematoma and placement of craniotomy flap in the right upper quadrant  subcutaneously.  The consideration was for cranioplasty in the future.  The patient was eventually stabilized, was moved to the Rehabilitation  Unit on February 22, 2008.  Remained there through discharge on  March 07, 2008.   Since discharge, the patient has been living at his home.  He has  finished all therapies at this time.  He ambulates with a cane and is  independent for bathing and dressing.  He has continent with bowel and  bladder.  His son manages most of his medicines for him.  The patient  did follow up with Dr. Franky Macho recently and his Keppra dose was  decreased to 500 mg daily and that should finish this weekend.  He has  had surgery to replace the flap probably in his right frontoparietal  region with Dr. Franky Macho.   The patient did see Dr. Caryl Never, his primary care physician, with no  changes made.  He has not seen Dr. Jacinto Halim, his cardiologist, since  approximately June.   At the present time, the patient remains off his blood pressure  medicines which were held while hospitalized.  He has not yet started on  his lisinopril at 20  mg daily and his Toprol-XL at 25 mg daily.  His  blood pressure was not elevated in the office today nor it had been  elevated as best the son call tell.   MEDICATIONS:  1. Zoloft 50 mg daily.  2. Multivitamin daily.  3. Iron 325 mg daily.  4. Prilosec 20 mg daily.  5. Keppra 500 mg daily.   REVIEW OF SYSTEMS:  Noncontributory.   PHYSICAL EXAMINATION:  GENERAL:  A well-appearing, elderly, adult male,  seated in a regular chair.  VITAL SIGNS:  Blood pressure is 117/49 with a pulse of 66, respiratory  rate 18, and O2 saturation 97% on room air.  EXTREMITIES:  He has 4+ to 5-/5 strength throughout the bilateral upper  and lower extremities.  Bulk and tone were normal and reflexes were 2+  and symmetrical.  He ambulates with a single-point cane in his right  upper extremity.   IMPRESSION:  1. Status post fall with frontotemporoparietal subdural hematoma,      status post craniotomy, February 05, 2008.  2. Recurrent subdural hematoma with evacuation of recurrent hematoma,      February 09, 2008, with placement of craniotomy flap to the right      upper quadrant.  3. Status post replacement of craniotomy flap to his right frontal      region.  4. Seizure prophylaxis.  5. Hypertension.  6. Depression.  7. Anemia.  8.  Chronic obstructive pulmonary disease.  9. Coronary artery disease with prior aortic valve replacement.   In the office today, we did not make any changes.  He will be off his  Keppra completely and is doing extremely well overall.  He has finished  all therapies at this time.  I have asked his son to keep an eye on his  blood pressure.  Apparently, the home health nurse continues to monitor  him at this time.  I have asked them to record his blood pressure  readings over the next week and continue that for several weeks and then  follow up with Dr. Caryl Never to see if restart of the blood pressure  medicine is appropriate.  At the present time, no restart is  necessary.  We will plan on seeing the patient in followup on an as needed basis.           ______________________________  Ellwood Dense, M.D.     DC/MedQ  D:  04/13/2008 11:51:27  T:  04/14/2008 01:03:53  Job #:  [id number]

## 2010-11-05 NOTE — Op Note (Signed)
NAME:  [name], [name] NO.:  [id number]   MEDICAL RECORD NO.:  [id number]          PATIENT TYPE:  INP   LOCATION:  3111                         FACILITY:  [facility]   PHYSICIAN:  Coletta Memos, M.D.     DATE OF BIRTH:  [birth year]   DATE OF PROCEDURE:  DATE OF DISCHARGE:                               OPERATIVE REPORT   PREOPERATIVE DIAGNOSIS:  Right subacute frontotemporal parietal subdural  hematoma.   POSTOPERATIVE DIAGNOSIS:  Right subacute frontotemporal parietal  subdural hematoma.   PROCEDURE:  Right frontotemporal parietal craniotomy for subdural  hematoma evacuation.   SURGEON:  Coletta Memos, MD   COMPLICATIONS:  None.   ANESTHESIA:  General endotracheal.   INDICATIONS:  [name] is an 77 year old gentleman for 1-  week decline and increasing weakness on his left side.  MRI and CT show  subdural fluid collection on left side.  I recommended and he and his  family agreed for him to undergo craniotomy for hematoma evacuation.   OPERATIVE NOTE:  [name] was brought to the operating room,  intubated, and placed under general anesthetic.  Foley catheter was  placed under sterile conditions.  He was positioned with his head in  three-pin [place].  After adequate anesthesia was obtained was turned  towards the left side.  There was a shoulder roll underneath the right  shoulder, and he was secured to the table with his neck not being tense.  His head was shaved, prepped, and he was draped in a sterile fashion.  I  infiltrated 15 mL of 0.5% lidocaine 1:200,000 epinephrine into the  scalp.  I opened the scalp in a reverse question mark style starting in  front of the right tragus and extending to almost just after the midline  proximally.  I opened this scalp, placed Raney clips to secure  hemostasis.  I then used monopolar cautery to divide the temporalis  muscle and reflected the bulk of the temporalis anteriorly.  I secured  that using  towel hooks on rubber bands to the drapes to provide traction  and exposure.  I then drilled bur holes into the frontotemporal and  parietal regions.  I then joined bur holes using craniotome and removed  the bone flap.  The dura anteriorly had been opened by the drill, so I  based my flap medially.  I encountered what was complex system of  membranes and a large amount of very dark, reddish-brown fluid.  I  irrigated thoroughly and removed all of the clots that I could see.  I  then after ensuring good evacuation, cauterized the edges of portion of  the membrane that I had to remove.  I then reapproximated the dura.  I  placed dural tack-up sutures.  I saw no reason to place a drain as the  brain was pulsatile.  I placed tack-up sutures around the edge and a  central tack-up suture along with closing the dura.  I did use some dura  form to cover some holes and some openings in the dura.  I then replaced  the bone flap  using plates  and screws.  I then reapproximated temporalis fascia.  I  then reapproximated the galea.  I then closed the skin edges with both  staples and 3-0 nylon.  I removed the Mayfield headholder.  I placed a  sterile dressing wrapping the head.  The patient was then extubated and  brought to recovery.           ______________________________  Coletta Memos, M.D.     KC/MEDQ  D:  02/05/2008  T:  02/05/2008  Job:  [id number]

## 2010-11-05 NOTE — Op Note (Signed)
NAME:  Gerald Dyer, Gerald Dyer NO.:  [id number]   MEDICAL RECORD NO.:  [id number]          PATIENT TYPE:  INP   LOCATION:  3108                         FACILITY:  [facility]   PHYSICIAN:  Coletta Memos, M.D.     DATE OF BIRTH:  1924-07-14   DATE OF PROCEDURE:  03/29/2008  DATE OF DISCHARGE:                               OPERATIVE REPORT   PREOPERATIVE DIAGNOSIS:  Cranial defect.   POSTOPERATIVE DIAGNOSIS:  Cranial defect.   PROCEDURES:  1. Retrieval of bone flap from the subcutaneous pocket.  2. Placement of bone into cranial defect, reapproximating it with      plates and screws.   COMPLICATIONS:  None.   SURGEON:  Coletta Memos, MD   ASSISTANT:  Cristi Loron, MD   INDICATIONS:  Gerald Dyer is an 77 year old gentleman who recently  presented with a subdural hematoma, which occurred after an operation.  I removed the bone flap, and postoperatively, he has done quite well.  At this point in time, the subdural was completely resolved and I felt  it appropriate to replace a bone flap.   OPERATIVE NOTE:  Gerald Dyer was brought to the operating room.  He was  intubated and placed under a general anesthetic without difficulty.  He  was positioned with his head on a doughnut.  Foley catheter was placed.  His abdomen and head were prepped and draped in a sterile fashion.  I  initially opened the abdominal wound.  I opened with a #10 blade and  took this down through the old incision.  I identified the bone flap,  and with monopolar cautery and blunt dissection, removed the bone flap  from the subcutaneous pocket.  I irrigated the pocket.  I closed the  pocket in a layered fashion using 2-0 Vicryls and then 3-0 Vicryls to  reapproximate subcutaneous tissue.  I used Dermabond for sterile  dressing.  I then turned my attention to the cranial incision.  I opened  out with a #10 blade and reflected that anteriorly.  I dissected  anteriorly where the temporalis fascia  become quite adherent to the  dura.  Once that was done and I had cleaned bone edges all around, I  then reapproximated the bone flap after placing plates and screws on its  edges.  This was done without difficulty.  I placed a central tack-up  suture.  Secondary to some oozing from the scalp flap, I did place a  subcutaneous drain overlying the flap underneath the scalp.  I then  reapproximated the scalp with subgaleal 3-0 Vicryls and then staples for  the skin edges.  I wrapped his head with sterile dressing.  Drain was  attached to bulb suction.  He tolerated it well.  He was extubated,  moving all extremities.           ______________________________  Coletta Memos, M.D.     KC/MEDQ  D:  03/29/2008  T:  03/29/2008  Job:  [id number]

## 2010-11-05 NOTE — H&P (Signed)
NAME:  Gerald, LIUZZI NO.:  [id number]   MEDICAL RECORD NO.:  [id number]          PATIENT TYPE:  INP   LOCATION:  3111                         FACILITY:  [facility]   PHYSICIAN:  Coletta Memos, M.D.     DATE OF BIRTH:  1924/10/12   DATE OF ADMISSION:  02/04/2008  DATE OF DISCHARGE:                              HISTORY & PHYSICAL   ADMITTING DIAGNOSIS:  Right chronic subdural hematoma.   INDICATIONS:  Mr. Garofano is an 77 year old gentleman who took a fall  approximately 2-1/2 to 3 weeks ago.  According to his family, it was  until this week; however, that he had a rapid decline in his ability to  walk.  They have noticed that he has been weak on his left side, both  arm and leg and this has progressed over the last week.  He otherwise  has been in his usual state of health.  Secondary to the weakness, he  was taken to his primary care physician.  An MRI was ordered today and  that revealed a large collection of fluid in the right subdural space.   Mr. Dombek has had no seizure activity.  He has had no loss of  consciousness during this time.  He has been alert throughout this time.   Past medical history includes chronic obstructive pulmonary disease,  colon cancer, and a cerebrovascular accident.   He does not smoke now.  Mr. Hemmer does not use IV drugs.  He does not  use alcohol.   Current medications are aspirin, Combivent, iron, lisinopril,  multivitamin, omeprazole, sertraline, Toprol XL.   He is allergic to CODEINE.   PHYSICAL EXAMINATION:  VITAL SIGNS:  Blood pressure 124/66, temperature  98, pulse 61, and respiratory rate 16.  GENERAL:  On examination, he is alert, oriented x4, and answering all  questions appropriately.  Memory, language, attention span, and fund of  knowledge are normal.  He is well kempt.  He is in absolutely no  distress lying on a stretcher.  He is quite cooperative.  HEENT:  Pupils are equal, round, and reactive to light.   Full  extraocular movements are appreciated.  Funduscopic examination is  normal.  He has symmetric facial movements and symmetric facial  sensation.  Hearing is intact to voice.  He is slightly hard of hearing.  Tongue protrudes in midline.  Uvula elevates in midline.  NEUROLOGIC:  Shoulder shrug is slightly asymmetric, left shoulder being  lower than the right.  He has no drift on examination, but he does have  weakness in all muscle groups tested on the left side when compared to  the right.  The left side would be graded however at 5-/5.  Proprioception is intact in both the upper and lower extremities.  Light  touch is also intact.  Muscle tone, bulk, and coordination are normal.  LUNGS:  Lung fields are clear.  HEART:  Regular rhythm and rate.  No murmurs or rubs are appreciated.  No cervical masses or bruits are appreciated.  No clubbing, cyanosis, or  edema is seen.   CT shows a large isodense  fluid collection in the subdural space on the  right side causing local mass effect.  There is shift of approximately  8.3 mm on my measurement from the midline, ventricular effacement.   DIAGNOSIS:  Right subacute subdural hematoma with small acute component.   Mr. Space I believe is weak on his left side secondary to the large  mass present in the subdural space on the right side.  I presume this to  be blood, subacute in nature.  My recommendation to Mr. Mergen and his  sons along with sister was that he undergo operative evacuation.  This  would be done via craniotomy on the right side.  Mr. Kryder has agreed  to undergo the operation.  Risks including bleeding, infection, need for  further surgery, recurrence of the subdural hematoma, no improvement in  stroke, and death were explained.  He understands and he wishes to  proceed as do his sons and his sister.           ______________________________  Coletta Memos, M.D.     KC/MEDQ  D:  02/04/2008  T:  02/05/2008  Job:   [id number]

## 2010-11-05 NOTE — Discharge Summary (Signed)
NAME:  Gerald Dyer, DIA NO.:  [id number]   MEDICAL RECORD NO.:  [id number]          PATIENT TYPE:  IPS   LOCATION:  4025                         FACILITY:  [facility]   PHYSICIAN:  Gerald Dyer, M.D.   DATE OF BIRTH:  10/08/1924   DATE OF ADMISSION:  02/22/2008  DATE OF DISCHARGE:  03/07/2008                               DISCHARGE SUMMARY   DISCHARGE DIAGNOSES:  1. Right frontotemporoparietal subdural hematoma status post      craniotomy, February 05, 2008.  2. Recurrent subdural hematoma with evacuation of hematoma, February 09, 2008 and placement of craniotomy flap to right upper quadrant.  3. Seizure prophylaxis.  4. Hypertension.  5. Depression.  6. Coronary artery disease with aortic valve replacement.  7. Chronic obstructive pulmonary disease.  8. Anemia.   HISTORY:  This is an 77 year old white male with history of  cerebrovascular accident in 2006 with little residual as well as aortic  valve replacement in 2007.  He was admitted on February 04, 2008 after a  fall 2-3 weeks prior and noted left-sided weakness and altered mental  status.  MRI showed a right subacute frontotemporoparietal subdural  hematoma.  Underwent craniotomy for evacuation of subdural hematoma  February 05, 2008 per Dr. Franky Dyer.  Placed on Keppra for seizure  prophylaxis.  Bouts of confusion with restraints use.  A followup  cranial CT scan February 09, 2008 showed recurrent subdural hematoma with  evacuation of hematoma and placement of craniotomy flap to the right  upper quadrant subcutaneously.  Consideration for cranioplasty would be  made in the future per neurosurgery.  Modified barium swallow February 14, 2008, placed on the dysphagia II nectar-thick liquid diet.  Blood  pressures monitored with lisinopril and Toprol.  The patient was  admitted for comprehensive rehab program.   PAST MEDICAL HISTORY:  See discharge diagnoses.   HABITS:  Remote smoker.  No alcohol.   ALLERGIES:   CODEINE.   SOCIAL HISTORY:  Widowed, retired, and lives alone.  Son plans to hire  assistance as needed at a 1-level home with 2 steps to entry.   FUNCTIONAL HISTORY:  Prior to admission was independent.  Functional  status upon admission to rehab services with minimum-to-moderate assist,  80 feet with a rolling walker.   MEDICATIONS PRIOR TO ADMISSION:  1. Lisinopril 20 mg daily.  2. Toprol-XL 25 mg daily.  3. Zoloft 50 mg daily.  4. Multivitamin daily.  5. Aspirin 325 mg daily.  6. Combivent inhaler as needed.  7. Iron supplement daily.  8. Prilosec 20 mg daily.  9. Celebrex 200 mg daily.   PHYSICAL EXAMINATION:  GENERAL:  This is a well-appearing white male in  no acute distress.  HEENT:  Craniotomy site healing nicely.  LUNGS:  Clear to auscultation.  CARDIAC:  Regular rate and rhythm.  ABDOMEN:  Soft and nontender.  Good bowel sounds.  EXTREMITIES:  The patient was moving all extremities.  His calves  remained cool without any swelling, erythema, and nontender.  He did  need some cues for safety and awareness.   REHABILITATION/HOSPITAL COURSE:  The patient was admitted to inpatient  rehab services with therapies initiated on a 3-hour daily basis  consisting of physical therapy, occupational therapy, speech therapy,  and rehabilitation nursing.  The following issues were addressed during  the patient's rehabilitation stay.  Pertaining to [name] right  frontotemporoparietal subdural hematoma with craniotomy and placement of  craniotomy flap to the right upper quadrant subcutaneously of 8 x 15.  Site was healing nicely.  He had been fitted with a helmet for a safety.  He would follow up with Neurosurgery for cranioplasty in the future.  He  remained on Keppra 500 mg twice daily for seizure prophylaxis.  No  seizure activity noted.  Blood pressures monitored.  His lisinopril and  Toprol were discontinued on February 22, 2008 due to some diastolic  pressures in the  50s.  Blood pressures overall remained well-controlled  thereafter with diastolic 65-73.  He would remain off his  antihypertensives and will followup with his primary care Gerald Dyer.  He  had a history of depression.  He remained on Zoloft.  He has been  attending his full therapies.  Pain management with the use of Celebrex  and Vicodin.  He had a history of aortic valve replacement.  He had been  on aspirin prior to admission.  Obviously, this remains as needed for  chronic obstructive pulmonary disease.  He exhibited no shortness of  breath.  Lungs remained clear to auscultation.  He was followed by  Speech Therapy for some initial swallowing difficulties.  Intravenous  fluids were used for secondary to his being on nectar-thick liquid diet.  His diet was steadily advanced.  His intravenous fluids were  discontinued.  He was now placed on thin liquids and mechanical soft  consistency which he tolerated well.  Functionally, he was minimal  assist to supervision overall for his mobility and minimal assist  overall for activities of daily living.  He was continent of bowel and  bladder.  He was doing well as far as basic communication, some  difficulty with complex higher-level learning, and supervision for basic  activities of daily living overall for his safety cognition issues.  Weekly interdisciplinary team conferences were held to discuss the  patient's estimated length of stay, any barriers to discharge, and full  family teaching being completed.   LABORATORY DATA:  Latest labs showed a sodium 136, potassium 4.5, BUN  18, and creatinine 1.15.  Latest hemoglobin of 10.7, hematocrit 30.4,  and platelet 404,000.   DISCHARGE MEDICATIONS:  1. Celebrex 200 mg daily.  2. Protonix 40 mg daily.  3. Keppra 500 mg twice daily.  4. Zoloft 50 mg daily.  5. Multivitamin daily.  6. Ferrous sulfate daily.  7. Combivent inhaler 1 puff 4 times daily as needed.  8. Vicodin 5/325 one or two  tablets every 4 hours as needed for pain,      dispense of 60 tablets.   The patient's lisinopril 20 mg daily remained on hold and Toprol-XL 25  mg daily remained on hold.   DIET:  Mechanical soft with thin liquids.   SPECIAL INSTRUCTIONS:  Continue protective helmet secondary to  craniotomy.   FOLLOWUP:  Follow up with Dr. Coletta Memos [phone] for planned  cranioplasty in the future.  He should follow up with his primary care  S[place] Crosby Dr. Arlyce Dice for all medical management.  He would see Dr. Ellwood Dyer back in the rehab service office as advised.      Mariam Dollar,  P.A.    ______________________________  Gerald Dyer, M.D.    DA/MEDQ  D:  03/06/2008  T:  03/06/2008  Job:  [id number]   cc:   Teena Irani. Arlyce Dice, M.D.  Coletta Memos, M.D.

## 2010-11-08 NOTE — Discharge Summary (Signed)
NAME:  Gerald Dyer, Gerald Dyer NO.:  [id number]   MEDICAL RECORD NO.:  [id number]          PATIENT TYPE:  INP   LOCATION:  3022                         FACILITY:  [facility]   PHYSICIAN:  Coletta Memos, M.D.     DATE OF BIRTH:  1925/01/09   DATE OF ADMISSION:  03/29/2008  DATE OF DISCHARGE:  04/01/2008                               DISCHARGE SUMMARY   [name] was admitted secondary to a cranial defect due to his bone  flap being removed after subdural hematoma evacuation.  He is admitted  now for replacement of his bone flap and bone flap retrieval.   [name] was admitted, taken to the operating room on March 29, 2008, without difficulty.  Postoperatively, he did very well.  He was  moving all extremities and had a normal neurologic examination.  He was  therefore transferred to the floor on March 31, 2008, and was  discharged home on April 01, 2008.  Wound was clean and dry.  No signs  of infection.           ______________________________  Coletta Memos, M.D.     KC/MEDQ  D:  06/09/2008  T:  06/10/2008  Job:  [id number]

## 2010-11-08 NOTE — Discharge Summary (Signed)
NAME:  Gerald Dyer NO.:  [id number]   MEDICAL RECORD NO.:  [id number]          PATIENT TYPE:  INP   LOCATION:  3039                         FACILITY:  [facility]   PHYSICIAN:  Pramod P. Pearlean Brownie, MD    DATE OF BIRTH:  Jan 18, 1925   DATE OF ADMISSION:  04/16/2005  DATE OF DISCHARGE:  05/03/2005                                 DISCHARGE SUMMARY   DIAGNOSES AT TIME OF DISCHARGE:  1.  Small right posterior frontal infarct. Etiology possibilities include      ascending aortic aneurysm, patent foramen ovale, hypercoagulable state      from colon cancer.  2.  Colon cancer, status post colectomy on April 25, 2005, by Dr. Lindie Spruce.  3.  Ascending aortic aneurysm. Consider surgery in the future by Dr. Laneta Simmers.  4.  Patent foramen ovale. Consider surgery by Dr. Jacinto Halim.  5.  Iron-deficiency anemia.  6.  Bilateral internal carotid artery stenosis, 40% to 60%.  7.  Depression.  8.  History of smoking.  9.  History of nasal fracture with surgical repair in the past.   MEDICATIONS AT TIME OF DISCHARGE:  1.  Zoloft 50 mg a day.  2.  Iron 150 mg b.i.d.  3.  Folic acid 2 mg daily.  4.  Multivitamin one a day.  5.  Aggrenox one pills b.i.d.  6.  Metoprolol 25 mg b.i.d.  7.  Tylox one p.o. q.4h. p.r.n. pain.   STUDIES PERFORMED:  1.  Chest x-ray with no acute pulmonary process.  2.  MRI of the brain revealed small acute nonhemorrhagic infarct, right      posterior frontal lobe. Old infarct left parietal area. Small infarct      left cerebellum.  Polypoid opacification right maxillary sinus. Small      vessel disease. Atrophy.  3.  MRA of the neck shows mild irregularity of proximal internal carotid      artery bilaterally without hemodynamically significant stenosis.      Slightly limited evaluation of the origin of the great vessels. May be      mild narrowing of the proximal left common carotid artery and the mid      aspect of the left subclavian. Dominant left vertebral artery,  ectatic      and diminutive irregular appearing right vertebral artery with narrowing      in several locations surrounding its course.  4.  MRA of the head shows mild narrowing of the supraclinoid, left internal      carotid artery. Mild branch vessel atherosclerotic type changes. No      significant stenosis basilar artery.  5.  CT angio of the chest shows aneurysmal dilatation of the ascending aorta      measuring at least 5 cm. Calcified aortic valve. Left ventricular      hypertrophy. Extensive emphysematous changes of the lungs. Bronchitic      changes of the left lower lobe with small nodules likely reflective of      sequela of prior infection. Follow-up CT in three to six months      recommended.  6.  CT  of the head, on admission, showed mild to moderate diffuse cerebral      and cerebellar atrophy. Diffusely enlarged and partially calcified left      vertebral artery. No acute abnormality.  7.  Orthopantogram shows extensive dental caries. Periapical abscesses      involving the teeth of the upper jaw.  8.  CT of the abdomen and pelvis show small low attenuation lesion in the      posterior segment of right lower lobe of the liver near the dome. Not      definitive for a cyst. MRI recommended for further evaluation. 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 Left lower      lobe pulmonary nodule noted. No cute findings in the pelvis. No sigmoid      mas not readily available or visible by CT.  9.  Transesophageal echocardiogram shows normal left ventricle. Moderate      aortic valvular regurgitation. Ascending aorta 5 to 5.5 cm saccular      aneurysm with no dissection or complex plaques. Mild mitral valvular      regurgitation. Normal left atrium. Left atrial appendage normal with no      thrombus identified. There was a large patent foramen ovale that      measured 6-7 mm with bidirectional stent associated with atrioseptal      aneurysm.  Positive agitated saline contrast study. Normal tricuspid      valve.  10. EKG shows normal sinus rhythm with occasional premature atrial      complexes, nonspecific ST-T wave abnormalities.  11. Carotid Doppler shows bilateral 40% to 60% ICA stenosis, not supported      by plaque morphology, vertebral artery flow antegrade. Lower extremity      Dopplers with no evidence of DVT, SVT, or Baker's cyst bilaterally.  12. 2-D echocardiogram shows EF of 55% to 65% with no wall motion      abnormalities. Mild diastolic dysfunction. Mild anterior wall      dilatation.  13. Colonoscopy showed malignant tumor in the sigmoid colon 4-5 cm with      partial obstruction. Biopsy taken. Diverticulosis of descending colon to      sigmoid colon. Angiodysplasia with five AVMs,  4 mm nonbleeding in      cecum.   LABORATORY STUDIES:  Surgical pathology revealed invasive colonic  adenocarcinoma with negative surgical margins of resection. No tumor  identified  in six pericolonic lymph nodes.  Gallbladder revealed chronic  cholecystitis and cholelithiasis: Anastomosis rings excision, no tumor  identified and it is a T3N0MX.   Last CBC performed May 01, 2005, with hemoglobin of 8.7, hematocrit  27.0, MCV 70.8, MCHC 32.3, RDW 21.0, platelet count 335,000, white blood  count 7.3, and red blood count 3.82. Last differential performed on April 27, 2005, with neutrophils 84, lymphocytes 10; otherwise normal. Chemistry  with potassium of 3.3, glucose 125, BUN 4, otherwise normal. Liver function  tests normal, 115, 9.9. Hemoglobin A1c 6.1, cholesterol 108, triglycerides  67, HDL 35, and LDL 60. Urine drug screen was negative.  Blood is O positive  with negative antibody screen. RPR nonreactive. Cardiac enzymes were normal.   HISTORY OF PRESENT ILLNESS:  [name] is a 77 year old right-  handed white male who has a fairly insignificant medical history. He comes to [facility] emergency room after  sudden onset of mutism and left-sided  weakness. The patient claimed this occurred around  10 a.m. shortly after  talking on the phone with his sister. He noticed he was unable to speak at  all and that the left arm went floppy. He was still able to ambulate. The  patient improved in the emergency room. NIH score was 1. With improvement of  his deficit the patient was felt not to be a TPA candidate, though he was  within the three hour timeframe. He was admitted to the hospital for stroke  evaluation.   HOSPITAL COURSE:  MRI did reveal an acute small right MCA branch infarct.  During workup the patient was found to have a bilateral ICA stenosis, a  large 6-7 mm patent foramen ovale, a 5 to 5.5 cm saccular ascending aortic  aneurysm, and anemia which led to a colonoscopy and a diagnosis of colon  cancer. It is unclear which etiology may be the source of the patient's  infarct, though likely between patent foramen ovale and aortic aneurysm.  Cardiology was consulted for the patent foramen ovale. Closure was  considered, but felt the aortic aneurysm took precedence. Plans were  underway to look at repair of the aneurysm when the colon cancer was  diagnosed. Decision was made to go ahead and remove the colon cancer and  delay PFO closure and aortic aneurysm repair to the future.  Surgery was  done by Dr. Lindie Spruce on November 3rd after he had a cardiac catheterization for  surgical clearance on the 2nd. The patient tolerated surgery well. He is  slightly deconditioned postoperatively and will require physical therapy at  home. Otherwise, the patient is able to tolerate diet and will be  discharged.   CONDITION ON DISCHARGE:  The patient is alert and oriented times three.  Speech is clear. No aphasia.  Extraocular movements intact. Visual fields  are full. The abdomen is soft, bowel sounds are present, and incision is  clean, dry, and intact. He had some generalized weakness and deconditioning,   although he moves all extremities times four and strength is 4+/5.   DISCHARGE PLAN:  1.  Discharge to home.  2.  Home health physical therapy and nursing assistance along with rolling      walker for home use.  3.  Aggrenox for secondary stroke prevention.  4.  Follow up with Dr. Myna Hidalgo for his recommendation; follow up with Dr.      Lindie Spruce in two weeks; follow up with Dr. Pearlean Brownie in two to three months who      will consider PFO closure at that time.  5.  Follow up with [facility], Dr. Jenne Campus, for their      recommendations.      Annie Main, N.P.    ______________________________  Sunny Schlein. Pearlean Brownie, MD    SB/MEDQ  D:  05/02/2005  T:  05/03/2005  Job:  [id number]   cc:   Pramod P. Pearlean Brownie, MD  Fax: [phone]   Teena Irani. Arlyce Dice, M.D.  Fax: [phone]   Cristy Hilts. Jacinto Halim, MD  Fax: [phone]   Cherylynn Ridges, M.D.  [id number]  [place]  [place] [address]   Rose Phi. Myna Hidalgo, M.D.  Fax: [phone]  Alison Murray  Fax: [phone]   Evelene Croon, M.D.  [address]  [place]  [place] [address]

## 2010-11-08 NOTE — Assessment & Plan Note (Signed)
[facility]                           GASTROENTEROLOGY OFFICE NOTE   Gerald Dyer, Gerald Dyer                   MRN:          [id number]  DATE:01/29/2006                            DOB:          1924-12-30    Referring doctor:  Dr. Yates Decamp.   REASON FOR CONSULTATION:  Anemia.   HISTORY:  This is a 77 year old gentleman with multiple significant medical  problems who is referred through the courtesy of Dr. Jacinto Halim regarding anemia.  Patient's history dates back to November of 2006 when he was admitted to the  hospital with a stroke.  At that time he was found to have iron-deficiency  anemia.  Colonoscopy was performed and revealed distal sigmoid or proximal  rectal cancer.  As well diverticulosis and multiple proximal colonic  vascular ectasias.  He underwent surgical resection of his colon cancer.  No  adjuvant therapy indicated.  His problems were complicated in February when  he had problems with progressive shortness of  breath.  He underwent  surgical repair of an ascending aortic aneurysm with aortic root/valve  replacement.  In addition, closure of a patent foramen ovale.  At that time  his hemoglobin levels ranged from 7.8 to 10.2.  He was subsequently moved to  the hospital with shortness of breath with a hemoglobin of 10.  Cause of  shortness of breath was uncertain.  He had undergone cardiac echo which was  stable.  He has been seeing Dr. Myna Hidalgo intermittently and is known to have  anemia.  More recent evaluation with Dr. Myna Hidalgo on January 05, 2006 revealed a  hemoglobin of 9.4.  MCV was 75.  Patient tells me that he has received an  iron infusion.  He is not on oral iron therapy.  He denies GI bleeding.  No  active GI symptoms such as dysphagia, abdominal pain, change in bowel habits  or weight loss.  His appetite is stable.  Two days after his visit with Dr.  Myna Hidalgo he saw Dr. Jacinto Halim and based on reports from the patient regarding  anemia was sent to this office.   PAST MEDICAL HISTORY:  1. History of stroke.  2. History of colon cancer - status post rectosigmoid colectomy.  3. History of ascending aortic aneurysm with surgical repair of the aortic      root and valve in February of 2007.  4. Status post cholecystectomy at the time of colon cancer surgery.  5. COPD.  6. Depression.  7. Carotid artery disease.  8. Hypertension.  9. A history of tobacco abuse.  10.Status post nasal fracture with surgical repair.   ALLERGIES:  Intolerant to CODEINE.   CURRENT MEDICATIONS:  1. Lisinopril 20 mg daily.  2. Toprol 25 mg daily.  3. Zoloft 50 mg daily.  4. Multivitamin.  5. Aspirin.  6. Combivent.   FAMILY HISTORY:  Mother and niece with colon cancer.   SOCIAL HISTORY:  Patient is widowed with three boys, lives alone, retired  from [employer], does not smoke or use alcohol currently.   REVIEW OF SYSTEMS:  Per diagnostic evaluation form.   PHYSICAL EXAMINATION:  GENERAL:  An elderly male in no acute distress.  VITAL SIGNS:  Blood pressure 118/68.  Heart rate is 68.  Weight 158.4  pounds.  He is 6 feet 1 inch in height.  HEENT:  Sclerae anicteric.  Conjunctiva are pink.  Oral mucosa intact.  No  adenopathy.  LUNGS:  Clear.  The breath sounds are distant.  HEART:  Regular, slight systolic murmur.  ABDOMEN:  Soft, non-tender with good bowel sounds, no organomegaly, mass or  hernia.  EXTREMITIES:  Without edema.  RECTAL:  Reveals hemoccult positive stool.  No other abnormalities.   IMPRESSION:  1. Iron deficiency anemia with hemoccult positive stool likely due to      chronic gastrointestinal blood loss of known angiodysplasia of the      proximal colon.  2. History of colon cancer status post resection.  3. Multiple significant other vascular problems as described above.   RECOMMENDATIONS:  1. Oral iron therapy.  2. Transfusions as needed under the guidance of his primary care physician      or Dr.  Myna Hidalgo to address symptomatic anemia.  3. Schedule upper endoscopy to exclude upper gastrointestinal mucosal      pathology in addition to known colonic AVM that may account for anemia.                                   Wilhemina Bonito. Eda Keys., MD   JNP/MedQ  DD:  01/31/2006  DT:  02/01/2006  Job #:  [id number]   cc:   Cristy Hilts. Jacinto Halim, MD  Evelena Peat, MD  Rose Phi. Myna Hidalgo, MD

## 2010-11-08 NOTE — Op Note (Signed)
NAME:  Gerald Dyer, Gerald Dyer NO.:  [id number]   MEDICAL RECORD NO.:  [id number]          PATIENT TYPE:  AMB   LOCATION:  DAY                          FACILITY:  [facility]   PHYSICIAN:  Charlynne Pander, D.D.S.DATE OF BIRTH:  [birth year]   DATE OF PROCEDURE:  06/26/2004  DATE OF DISCHARGE:                                 OPERATIVE REPORT   PREOPERATIVE DIAGNOSES:  1.  Aortic insufficiency.  2.  Pre-Aortic valve replacement dental protocol.  3.  Chronic apical periodontitis.  4.  Chronic periodontitis.  5.  Multiple retained roots.  6.  Dental caries.   POSTOPERATIVE DIAGNOSES:  1.  Aortic insufficiency.  2.  Pre-Aortic valve replacement dental protocol.  3.  Chronic apical periodontitis.  4.  Chronic periodontitis.  5.  Multiple retained roots.  6.  Dental caries.   OPERATIONS:  1.  Extraction of remaining teeth (tooth numbers 2, 3, 4, 6, 7, 8, 9, 10,      11, 12, 13, 14, 21, 22, 23, 24, 25, 26, 27, 28).  2.  Four quadrants of alveoloplasty .   SURGEON:  Charlynne Pander, D.D.S.   ASSISTANT:  Elliot Dally (Sales executive).   ANESTHESIA:  General anesthesia via nasoendotracheal tube.   MEDICATIONS:  1.  Ampicillin 2.0 g IV prior to invasive dental procedures.  2.  Local anesthesia with a total utilization of 5 carpules each containing      36 mg of Xylocaine with 0.018 mg of epinephrine as well as 2 carpules      each containing 9 mg of bupivacaine with 0.009 mg of epinephrine.   SPECIMENS:  There were 20 teeth which were discarded.   CULTURES:  None.   DRAINS:  None.   COMPLICATIONS:  None.   ESTIMATED BLOOD LOSS:  100 mL.   FLUIDS:  1650 mL of lactated Ringer solution.   INDICATIONS:  The patient was recently diagnosed with aortic insufficiency  which required an aortic valve replacement procedure.  The patient was  examined and treatment planned to rule out dental infection which would  affect the patient's systemic health and anticipated  heart valve surgery.  The patient was given various treatment options and determined that he  wished to proceed with extraction of all remaining teeth with alveoloplasty  as indicated in the operating room.  This treatment plan was formulated to  decrease the risk and complications associated with dental infection from  affecting the patient's systemic health and anticipated heart valve surgery.   OPERATIVE FINDINGS:  The patient was examined in operating room #6.  The  teeth were identified for extraction.  The patient was noted to be affected  by chronic apical periodontitis, chronic periodontitis, multiple dental  caries, multiple retained root segments, and multiple suboptimal dental  restorations.  The aforementioned necessitated the removal of all remaining  teeth with alveoloplasty. .   DESCRIPTION OF PROCEDURE:  The patient was brought to the main operating  room #6.  The patient was placed in the supine position on the operating  room table.  General anesthesia was induced with a nasoendotracheal tube.  The  patient was then prepped and draped in the usual manner for a dental  medicine procedure.  A throat pack was placed at this time.  The oral cavity  was thoroughly examined with the findings as noted above.  The patient was  then ready for the dental medicine procedure as follows:   Local anesthesia was administered sequentially over the 2-hour long  procedure with a total utilization of 5 carpules each containing 36 mg of  Xylocaine with 0.018 mg of epinephrine as well as 2 carpules each containing  9 mg of bupivacaine with a 0.009 mg of epinephrine.   The maxillary quadrants were first approached.  Anesthesia was delivered  with infiltration as indicated.  A 15 blade incision was made from distal of  number 1 and extended to the mesial of #11.  A surgical flap was then  carefully reflected.  The teeth were then subluxated with a series of  straight elevators.  A 53R  forceps was utilized to the deliver tooth numbers  2 and 3 without complications.  Tooth number 4, 6, 7, 8, and 9 were then  removed with a 150 forceps without complications.  Alveoplasty was then  performed utilizing a rongeurs and bone file.  The surgical site was then  irrigated with copious amounts of sterile saline.  The surgical site was  then closed from the distal of #1 and extended to the mesial of #8 utilizing  3-0 chromic gut suture in a continuous interrupted suture technique x1.   The maxillary left quadrant was then approached.  A 15 blade incision was  then made from the distal of #15 and extended to the mesial of #11.  A  surgical flap was then carefully reflected.  Appropriate amounts of buccal  and interseptal bone were then removed around tooth numbers 10, 11, 12, 13,  and 14 with a surgical handpiece and bur and copious amounts of sterile  saline.  These teeth were then subluxated with a series of straight  elevators.  The crowns on tooth numbers 14, 13, and 12 were then removed  with a 150 forceps without complications.  Tooth numbers 10, 11, 12, and 13  were then removed with a 150 forceps without complications.  The coronal  segment of tooth #14 was then removed with a 53L forceps.  This left the  remaining roots.  Surgical handpiece and bur and copious amounts of sterile  saline were utilized to section the roots appropriately.  These roots were  then removed with a 150 forceps without further complication.  Alveoplasty  was then performed utilizing a rongeurs and bone file.  The surgical site  was then irrigated with copious amounts of sterile saline.  Tissues were  approximated and trimmed appropriately.  The surgical site was then closed  from the distal of #15 and extended to the mesial of #9 utilizing 3-0  chromic gut suture in a continuous interrupted suture technique x1.  Two separate interrupted sutures were then placed to further close the surgical   site.   The mandibular teeth were then approached.  Bilateral inferior alveolar  nerve blocks were then given with bupivacaine.  Further infiltration was  then achieved utilizing the lidocaine with epinephrine appropriately.  The  mandibular left quadrant was then approached.  A 15 blade incision was made  from the distal of #19 and extended to the distal of #30.  A surgical flap  was then carefully reflected.  The remaining lower teeth were then  subluxated with a series of straight elevators.  The crown on tooth #20 was  then removed with a 151 forceps without complications.  This left the root  remaining.  Surgical handpiece and bur was then utilized to remove bone  around tooth #21 appropriately.  These teeth were then further subluxated,  and tooth numbers 21, 22, 23, 24, 25, and 26 were then removed with a 151  forceps without complications.  At this point in time, the surgical  handpiece and bur and copious amounts of sterile saline were utilized to  remove buccal and interseptal bone around tooth numbers 27 and 28  appropriately.  These teeth were then resubluxated.  The crown on tooth #28  was then removed without complication.  This left the root #28 and tooth  #27.  These teeth were then removed with a 151 forceps without  complications.  Alveoplasty was then performed utilizing a rongeurs and bone  file.  The surgical site was then irrigated with copious amounts of sterile  saline.  These tissues were approximated and trimmed appropriately.  The  surgical site was then closed from the distal of #19 and extended to the  mesial of #24 utilizing 3-0 chromic gut suture in a continuous interrupted  suture technique x1.  One interrupted suture was then utilized to further  close the surgical site in the area of number 20, 21.  The mandibular right  quadrant was then approached.  The surgical site was then closed from the  distal of #30 and extended to the mesial of #25 utilizing 3-0  chromic gut  suture in a continuous interrupted suture technique x1.   At this point in time, the entire mouth was irrigated with copious amounts  of sterile saline.  The patient was examined for complications, seeing none,  the dental medicine procedure was deemed to be complete.  The throat pack  was removed at this time.  A series of 4x4 gauzes and an oral airway was  placed at this time.  The patient was then handed over to the anesthesia  team for final disposition.  After appropriate amount of time, the patient  was extubated and taken to the post anesthesia care unit with stable vital  signs and a good oxygenation level.  All counts were correct for the dental  medicine procedure.  The patient will be given appropriate pain medication  as indicated.  The patient will be seen in approximately 1 week for  evaluation for suture removal.      Charlynne Pander, D.D.S.  Electronically Signed    RFK/MEDQ  D:  06/26/2005  T:  06/26/2005  Job:  [id number]   cc:   Evelene Croon, M.D.  [address]  [place]  [place] [address]   Cristy Hilts. Jacinto Halim, MD  Fax: [phone]

## 2010-11-08 NOTE — Op Note (Signed)
NAME:  Gerald Dyer, Gerald Dyer NO.:  [id number]   MEDICAL RECORD NO.:  [id number]          PATIENT TYPE:  INP   LOCATION:  3039                         FACILITY:  [facility]   PHYSICIAN:  Cherylynn Ridges, M.D.    DATE OF BIRTH:  [birth year]   DATE OF PROCEDURE:  04/25/2005  DATE OF DISCHARGE:                                 OPERATIVE REPORT   PREOPERATIVE DIAGNOSIS:  Sigmoid adenocarcinoma.   POSTOPERATIVE DIAGNOSES:  1.  Proximal rectal carcinoma.  2.  Cholelithiasis.   PROCEDURE:  1.  Exploratory laparotomy.  2.  Low anterior resection.  3.  Cholecystectomy.   SURGEON:  Cherylynn Ridges, M.D.   ASSISTANTS:  1.  Gabrielle Dare. Janee Morn, M.D.  2.  Physician assistant student, Darcel Bayley.   ANESTHESIA:  General endotracheal.   ESTIMATED BLOOD LOSS:  Less than 150 mL.   COMPLICATIONS:  None.   CONDITION:  Stable.   SPECIMENS:  1.  Rectosigmoid colon.  2.  Gallbladder.   FINDINGS:  The patient had a proximal rectal tumor.  She also had a large  gallstone in the gallbladder with no evidence of any obstruction, no  evidence of metastatic disease in the liver or any other organ.   OPERATION:  The patient was taken to the operating room and placed on the  table in a supine position.  After an adequate endotracheal anesthetic was  administered, she was prepped and draped in the usual sterile manner in the  supine position.   A midline incision was made from above the umbilicus to below.  We took it  down through the midline fascia using electrocautery and once we entered the  peritoneal cavity, placed the patient in Trendelenburg position to get  adequate exposure the pelvis.  It was noted immediately upon palpating the  area that he had a proximal rectum/distal sigmoid tumor that required a low  anastomosis.  The patient was to be placed up the stirrups later on in the  case while maintaining adequate prep.   The patient had a reticulating TA stapler placed across  the rectum distal to  the tumor, approximately 5 cm distal to the tumor.  We transected at that  point, placing a Kelly clamp along the proximal colon, then we took the  mesentery between that and the midportion of the sigmoid colon using Kelly  clamps and 2-0 silk ties.  We transected the proximal sigmoid colon using a  GIA 55 stapler and then removed the specimen.  We opened it off the field  and found there to be an adequate margin distally and a very obvious tumor.   We did the anastomosis later on; however, upon exploration, I noted that the  patient had a large gallstone, so we put a Thompson-Barr retractor in place,  placed the patient in reversed Trendelenburg and then did an open  cholecystectomy, placing a clamp on the gallbladder and dissecting out the  cystic duct and cystic artery, endoclipping those doubly using a  laparoscopic clip applier, and then dissected the gallbladder out of its bed  with minimal difficulty, placing a piece  of Surgicel in the bed prior to  closure.   Once the gallbladder was removed, we placed the patient back in  Trendelenburg position.  He was placed up in stirrups, maintaining the  sterile field.  We used a 25-mm Premium EEA stapler.  The 2-0 Prolene suture  was used to make a pursestring in the proximal colon, which was subsequently  attached to the part inserted through the patient's rectum and brought  through the staple line, just anterior to the staple line, using a sharp  trocar.  Once we had connected it back to the stapling device, we closed it  down, then stapled.  There was no evidence of a leak using the saline and  gas test or by direct inspection with the proctoscope.   Once this anastomosis was completed, we changed our gloves and then we  placed On-Q catheters bilaterally in the preperitoneal space and connected  to the infusion device.  They were secured in place with Steri-Strips.  We  then closed the fascia using a running #1  PDS suture.  All counts were  correct.  The stainless steel staples were  used to close the skin and a  sterile dressing applied.      Cherylynn Ridges, M.D.  Electronically Signed     JOW/MEDQ  D:  04/25/2005  T:  04/26/2005  Job:  [id number]

## 2010-11-08 NOTE — Cardiovascular Report (Signed)
NAME:  Gerald Dyer, Gerald Dyer NO.:  [id number]   MEDICAL RECORD NO.:  [id number]          PATIENT TYPE:  INP   LOCATION:  3039                         FACILITY:  [facility]   PHYSICIAN:  Darlin Priestly, MD  DATE OF BIRTH:  March 20, 1925   DATE OF PROCEDURE:  04/24/2005  DATE OF DISCHARGE:                              CARDIAC CATHETERIZATION   PROCEDURES PERFORMED:  1.  Left heart catheterization.  2.  Coronary angiography.  3.  Left ventriculogram.  4.  Ascending aortography.   ATTENDING:  Darlin Priestly, M.D.   COMPLICATIONS:  None.   INDICATIONS:  [name] is a 77 year old male patient of Dr. Jimmye Norman,  Dr. Lesia Sago, Dr. Arlyce Dice admitted on April 16, 2005 with a sudden left-  sided weakness.  He was subsequently found to have right parietal MCA  infarct, questionable embolus, and underwent transesophageal echocardiogram  revealing a PFO.  At that time he was also noted to have moderately dilated  ascending aorta with moderate aortic insufficiency.  He also was found to  have microcytic anemia and underwent GI evaluation for anemia finding a  possible colon CA, now requiring resection.  He is now brought for cardiac  catheterization prior to his GI surgery.   DESCRIPTION OF OPERATION:  After giving informed written consent, patient  brought to the cardiac catheterization laboratory.  Right and left groin  shaved, prepped, and draped in usual sterile fashion.  ECG monitor  established.  Using modified Seldinger technique, a #6-French arterial  sheath inserted in right femoral artery.  6-French diagnostic catheters were  then used to perform diagnostic angiography.   The aorta is noted to be markedly dilated requiring a JL6 to engage the left  main.   Left main is a large vessel with no significant disease.   LAD is a large vessel, coursed to the apex, gave rise to two diagonal  branches.  The LAD has no significant disease.   First and second  diagonals are medium-sized vessels with no significant  disease.   Left circumflex is a large vessel coursing the AV groove and gave rise to  two obtuse marginal branches.  The AV groove circumflex has no significant  disease.   First and second OMs are medium-sized vessels which bifurcate distally with  no significant disease.   The right coronary artery is a large vessel which is dominant, gives rise to  both PDA/posterolateral branch.  It also gave rise to a high RV marginal  branch.  There is no significant disease in the RCA, PA, or posterolateral  branch.   Left ventriculogram reveals mildly depressed EF at approximately 40% with  mild global hypokinesis.   Ascending aortography reveals markedly dilated ascending aorta with no  evidence of dissection.  There does appear to be moderate aortic  insufficiency.   HEMODYNAMICS:  Systemic arterial pressure 139/46, LV systemic pressure  130/2, LVEDP of 9.   CONCLUSION:  1.  No significant coronary artery disease.  2.  Mildly depressed left ventricular systolic function.  3.  Markedly dilated ascending aorta with moderate aortic insufficiency.  Darlin Priestly, MD  Electronically Signed     RHM/MEDQ  D:  04/24/2005  T:  04/24/2005  Job:  [id number]   cc:   Cherylynn Ridges, M.D.  [address]  [place]  [place] [address]   C. Lesia Sago, M.D.  Fax: [phone]   Arlyce Dice, M.D.

## 2010-11-08 NOTE — Consult Note (Signed)
NAME:  Gerald Dyer, Gerald Dyer NO.:  [id number]   MEDICAL RECORD NO.:  [id number]          PATIENT TYPE:  INP   LOCATION:  3039                         FACILITY:  [facility]   PHYSICIAN:  Evelene Croon, M.D.     DATE OF BIRTH:  13-Oct-1924   DATE OF CONSULTATION:  04/19/2005  DATE OF DISCHARGE:                                   CONSULTATION   CONSULTING PHYSICIAN:  Evelene Croon, M.D.   REFERRING PHYSICIAN:  Dr. Pearlean Brownie.   REASON FOR CONSULTATION:  Large PFO, moderate aortic insufficiency, and 5.5-  cm ascending aortic aneurysm.   HISTORY OF PRESENT ILLNESS:  The patient is a 77 year old previously healthy  gentleman who was admitted, on April 16, 2005, after he presented with  sudden onset of mutism and left-sided weakness.  It occurred about 10 a.m.  and he noted that he was not able to speak at all and his left arm went  floppy.  He was brought to the emergency room and had some improvement. He  has regained strength in his left arm.  A CT scan of the head was done and  was unremarkable.  An MRI of the brain showed a small right posterior  parietal acute infarct.  MR angiogram of the brain showed no significant  arterial stenosis. Carotid Doppler examination showed bilateral 40-60%  internal carotid artery stenosis by velocity but the degree of stenosis was  not supported by a plaque morphology with minimal plaque noted bilaterally.  Vertebral artery flow was antegrade.  The patient subsequently had a  transesophageal echocardiogram to look for intracardiac thrombus and this  showed a normal left atrial appendage without clot.  There is a large PFO  with bidirectional shunt, with positive bubble study.  It measured 6-to-7-mm  in diameter.  There was mild mitral regurgitation.  There was moderate to  moderately severe aortic insufficiency with a 5-to-5.5-cm saccular ascending  aortic aneurysm.  There was no dissection.  The aortic arch and descending  thoracic aorta appeared  normal.  He subsequently underwent CT scan of the  chest which showed a 5.5-cm aortic aneurysm involving the aortic root and  ascending aorta.  There was some left ventricular enlargement. The lung  window showed extensive emphysematous changes with bi apical pleural  parenchymal scarring as well as bronchiectasis at the left base.  There was  a 7-mm left lower lobe nodule and two pleural-based 6-mm nodules in the left  lower lobe.   PAST MEDICAL HISTORY:  1.  History of depression.  2.  He has a history of nasal fracture with surgical repair in the past.   ALLERGIES:  CODEINE.   REVIEW OF SYSTEMS:  GENERAL:  He denies any fever or chills.  He denies  fatigue.  EYES:  Negative.  ENT:  He does not see a dentist regularly and  has had some broken teeth.  ENDOCRINE:  He denies diabetes and  hypothyroidism.  CARDIOVASCULAR:  He denies any chest pain or pressure.  He  denies any exertional dyspnea. He has had no palpitations.  He denies PND or  orthopnea.  RESPIRATORY:  He denies cough and sputum production.  GI:  He  has had no nausea or vomiting, nor has he had melena or bright red blood per  rectum.  GU:  He denies dysuria and hematuria.  MUSCULOSKELETAL:  He denies  arthralgias and myalgias. VASCULAR:  He denies claudication and phlebitis.  NEUROLOGICAL:  He denies any previous history of stroke or TIA.   MEDICATIONS PRIOR TO ADMISSION:  1.  Zoloft 50 mg q.d.  2.  Multivitamin q.d..   SOCIAL HISTORY:  He had a previous smoking history but does not currently  smoke.  He denies alcohol abuse.  He is retired.  He is widowed and lives in  [place], [place], by himself.  He has three sons who are here  with him today.   FAMILY HISTORY:  His mother died of unknown causes.  His father died of old  age.  He has a sister who is alive and well and elderly.  There is no  history of aneurysmal disease.   PHYSICAL EXAMINATION:  VITAL SIGNS:  His blood pressure 133/56, and his   pulse is 64 and regular.  Respiratory rate is 18 and unlabored.  GENERAL:  He is a well-developed, elderly, white male in no distress.  HEENT:  Show him to be normocephalic and atraumatic.  Pupils are equal and  reactive to light accommodation. Extraocular muscles are intact.  Throat is  clear.  His teeth are in fair condition with at least one broken off tooth  at the gum line.  NECK:  Shows normal carotid pulses bilaterally.  There is no bruit.  There  is no adenopathy or thyromegaly.  CARDIAC:  Shows a regular rate and rhythm with normal S1-S2.  There is no  murmur,rub, or gallop.  LUNGS:  Clear.  ABDOMEN:  Shows active bowel sounds. His abdomen is soft and nontender.  There are no palpable masses or organomegaly.  EXTREMITIES:  Show no peripheral edema.  Pedal pulses are palpable  bilaterally.  SKIN:  Warm and dry.  NEUROLOGIC:  Shows him to be alert and oriented x3.  He has good motor  strength in his left hand now.   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  He had made a good recovery from this small stroke.  He has  previously been healthy, although his CT scan of the chest does show  extensive emphysematous changes as well as scarring in his lungs.  I think  surgical repair of his of PFO and replacement of the aortic valve and  ascending aortic aneurysm would probably be the best treatment once he  completely recovers from his stroke.  The PFO certainly may be the cause of  his stroke.  His aortic aneurysm is 5.5-cm which is the usual threshold at  which we recommend replacement of an aneurysm.  The alternative would be to  close his PFO percutaneously and continue to follow his aortic aneurysm and aortic insufficiency.  There would be an increased risk of complications due  to his  aortic aneurysm given the size, but he is 77 years old and I think  this would be a reasonable alternative if he so desires to avoid surgery.  I  discussed the alternatives of surgical versus nonsurgical treatment with he  and his three sons. We discussed  the benefits and risks of surgery in  detail.   I will plan to see him back in the office in about 2-3 weeks to discuss this  further with them before he makes a final decision.  If he decides to have  surgery, then we would have to arrange an outpatient dental evaluation,  preoperatively and would also need to have cardiology perform cardiac  catheterization prior to the heart surgery.  We will obtain pulmonary  function testing to further evaluate his lungs prior to heart surgery.      Evelene Croon, M.D.  Electronically Signed     BB/MEDQ  D:  04/19/2005  T:  04/19/2005  Job:  [id number]   cc:   Pearlean Brownie, Dr.   [facility]   Evelene Croon, M.D.  [address]  [place]  [place] [address]   medical record

## 2010-11-08 NOTE — Consult Note (Signed)
NAME:  Gerald Dyer, Gerald Dyer NO.:  [id number]   MEDICAL RECORD NO.:  [id number]          PATIENT TYPE:  INP   LOCATION:  3039                         FACILITY:  [facility]   PHYSICIAN:  Rose Phi. Myna Hidalgo, M.D. DATE OF BIRTH:  [birth year]   DATE OF CONSULTATION:  05/02/2005  DATE OF DISCHARGE:  05/03/2005                                   CONSULTATION   REASON FOR CONSULTATION:  Stage II (T3, N0, M0) adenocarcinoma of the  sigmoid colon.   HISTORY OF PRESENT ILLNESS:  Gerald Dyer is a very nice 77 year old white  gentleman who has a history of abdominal aortic aneurysm. He also has a  history of aortic insufficiency.  He does have a past history of CVA.  Preoperatively, he subsequently was brought to the emergency room after  presenting with sudden onset of mutism and left-sided weakness. This  occurred on April 16, 2005. He was brought to the emergency room. A CT of  the head was negative. MRI of the brain showed a small right posteroparietal  infarct. Angiogram of the brain showed no arterial stenosis. He did have  some mild internal carotid artery plaque bilaterally. He subsequently  underwent an echocardiogram which did not reveal any intracardiac thrombus.  He was starting to have severe aortic insufficiency. He subsequently  underwent CT of the chest which showed a 5.5 cm aortic aneurysm involving  the aortic root and ascending aorta. He had a 7-mm left lower lobe nodule  and two pleural-based 6-mm nodules in the left lower lobe.   He did undergo cardiac catheterization. This showed a markedly dilated  ascending aorta with aortic insufficiency. He was found on admission to have  a hemoglobin of 9. He was found to have microcytic index. He had heme  positive stools. Apparently, he underwent colonoscopy on April 22, 2005.  This was done by Dr. Yancey Flemings. Not surprisingly, this did show a mass in  the sigmoid colon with partial obstruction. Biopsies were taken and  the  pathology report ([id number]) showed an invasive adenocarcinoma. Dr. Lindie Spruce  was consulted. Gerald Dyer underwent formal scans. A CT of the abdomen and  pelvis was done. This did show a 1.5 x 0.8-cm low attenuation lesion in the  right lobe of the liver. This was ill defined. Of course, the radiologist  could not rule out metastasis. He did have the left lower lobe pulmonary  nodules. This was again seen on the CT angiogram. These nodules were less  than 1 cm. CT of the pelvis did not show any obvious sigmoid mass.   Dr. Lindie Spruce went ahead and took Gerald Dyer to surgery. This was done on  April 25, 2005. The operative report showed no obvious metastasis liver or  within the abdominal cavity. The tumor was removed. The pathology report  ([id number]) showed a mildly differentiated adenocarcinoma. Tumor measured 3.3  cm. It invaded into and focally into the muscularis. There was no vascular  invasion. No perforation of the visceral peritoneum. All margins were  negative. Six lymph nodes were sampled and all were negative for malignancy.  As such, it appears  that Gerald Dyer is a stage II (T3, N0, M0)  adenocarcinoma.   A preoperative CEA level unfortunately was not done. He had normal LFTs.   We subsequently were asked to see Gerald Dyer.   He has had no abdominal symptoms. He has had no abdominal pain. There has  been no change in bladder habits. He has had no cough or shortness of  breath.  There is no weight loss or weight gain.   PAST MEDICAL HISTORY:  History of depression.   ALLERGIES:  CODEINE.   CURRENT MEDICATIONS:  1.  Zoloft 50 mg p.o. daily.  2.  Protonix 40 mg daily.  3.  Aggrenox one p.o. daily.  4.  Iron 150 mg p.o. b.i.d.  5.  Folic acid 2 mg daily.   SOCIAL HISTORY:  Negative for tobacco or alcohol use.   FAMILY HISTORY:  Unknown by the patient.   REVIEW OF SYSTEMS:  As stated in the history of present illness. No  additional positives or negatives  are noted.   PHYSICAL EXAMINATION:  GENERAL: This is a thin, but fairly well-nourished  white gentleman in no obvious distress.  VITAL SIGNS: Temperature 98.9, pulse 82, respiratory rate 18, blood pressure  125/60, weight 160, and height 72 inches.  HEENT/NECK: Normocephalic and atraumatic skull. There are no ocular or oral  lesions. There are no palpable cervical or supraclavicular lymph nodes.  LUNGS: Decreased breath sounds throughout all lung fields.  CARDIAC: Regular rate and rhythm with a normal S1 and S2. No murmurs, rubs,  or bruits.  ABDOMEN: Well healing laparotomy scar. He has slight tenderness about the  abdomen. Bowel sounds are slightly decreased. There is obvious  hepatosplenomegaly.  EXTREMITIES: Slight weakness over on his left upper and lower extremities.  NEUROLOGIC: No focal neurologic deficits. Cranial nerves are intact. He has  good speech. He has some slight difficulty with word finding.   His laboratory studies reveal white cell count of 7.3, hemoglobin 8.7,  hematocrit 27, platelet count 335,000. His sodium is 138, potassium 3.3, BUN  4, creatinine 0.9.   IMPRESSION:  Gerald Dyer is a 77 year old gentleman with what certainly  appears to be a stage II (T3, N0, M0) adenocarcinoma of the sigmoid colon.  He underwent resection. This certainly does appear to fortuitous in  discovery for Gerald Dyer.   I believe that given the characteristics of his underlying malignancy, he  has a favorable stage II colon cancer. I do not see any adverse diagnostic  features from his underlying cancer.   As such, I really do not believe that he would benefit from adjuvant  chemotherapy.   The variable is the tiny, nondescript pulmonary nodule. In addition, this  is a solitary hepatic lesion. It certainly would have helped to have had a  CEA preoperatively. However, Gerald Dyer is totally asymptomatic. I do not think that giving him chemotherapy, if  he had metastatic  disease, would  really be to his betterment. His former status is ECOG II and I believe he  needs to recover more from his recent CVA.   We definitely need to follow up Gerald Dyer in the office. We will go ahead  and get him back after the Thanksgiving holidays.   He will soon need to be on iron after discharge.   I appreciate the opportunity to see Mr. Frommelt. I remember seeing his wife  several years ago, but she has subsequently passed on.      Rose Phi. Ennever,  M.D.  Electronically Signed     PRE/MEDQ  D:  05/02/2005  T:  05/04/2005  Job:  [id number]   cc:   Cherylynn Ridges, M.D.  [address], [address]  [place]  [place] [address]   Teena Irani. Arlyce Dice, M.D.  Fax: [phone]   Darlin Priestly, MD  Fax: [phone]

## 2010-11-08 NOTE — H&P (Signed)
NAME:  Gerald Dyer, Gerald Dyer NO.:  [id number]   MEDICAL RECORD NO.:  [id number]          PATIENT TYPE:  INP   LOCATION:  1828                         FACILITY:  [facility]   PHYSICIAN:  Marlan Palau, M.D.  DATE OF BIRTH:  [birth year]   DATE OF ADMISSION:  04/16/2005  DATE OF DISCHARGE:                                HISTORY & PHYSICAL   HISTORY OF PRESENT ILLNESS:  Gerald Dyer is a 77 year old right-handed  white male born Apr 13, 1925 with a fairly unremarkable past medical  history.  This patient is followed by Dr. Arlyce Dice for his medical needs, but  has been relatively healthy.  Patient comes into the [facility] Emergency  Room this a.m. after sudden onset of mutism and left-sided weakness.  Patient claims the event occurred around 10:00 a.m. shortly after talking on  the phone with the sister.  Patient noted that he was not able to speak at  all, noted that the left arm went floppy, was still able to ambulate,  however.  Patient felt there was some numbness on the left face and arm.  Patient denied any headache or visual field disturbance.  Patient was  brought to the emergency room and apparently has had some improvement in his  abilities.  Patient is now able to speak and understand language well.  Patient believes that he never lost the ability to understand language.  Patient has regained strength in the left arm, but notes that the left arm  is clumsy.  Patient has no problems with the legs.  Patient denies headache.  CT scan of the head was done and was unremarkable.  No acute changes seen.  Some atrophy was noted.  Neurology was asked to see this patient as a code  stroke.   PAST MEDICAL HISTORY:  1.  Episode of left arm weakness, mutism that has been transient.  2.  History of depression.  3.  History of nasal fracture with surgical repair in the past.   MEDICATIONS:  1.  Zoloft 50 mg a day.  2.  Multivitamin.   Patient was not on aspirin prior  to this evaluation.  Does not smoke or  drink.  Has an allergy to CODEINE.   SOCIAL HISTORY:  This patient is a widower.  Lives in the [place]  [place] area.  Is retired.  Patient has three sons who alive and well.   FAMILY HISTORY:  Mother died unknown cause.  Father died of old age.  Sister is alive and well.   REVIEW OF SYSTEMS:  Notable for no recent fevers, chills.  Patient denies  headache, neck pains, shortness of breath, chest pain, abdominal pain.  Denies problems controlling the bowels or bladder, nausea, or vomiting.  Patient denies any dizziness, headache, visual field disturbance.  No prior  history of similar events.   PHYSICAL EXAMINATION:  VITAL SIGNS:  Blood pressure 157/58, heart rate 67.  GENERAL:  This patient is a fairly well-developed white male who is alert  and cooperative at the time of examination.  HEENT:  Head is atraumatic.  Eyes:  Pupils are  equal, round, and reactive to  light.  Disks are flat bilaterally.  NECK:  Supple.  No carotid bruits noted.  RESPIRATORY:  Clear.  CARDIOVASCULAR:  Regular rate and rhythm.  No obvious murmurs or rubs noted.  EXTREMITIES:  Without significant edema.  ABDOMEN:  Positive bowel sounds.  No organomegaly or tenderness noted.  NEUROLOGIC:  Cranial nerves as above.  Facial symmetry is present.  Patient  has good sensation to the face pin prick/soft touch bilaterally.  Has good  strength to facial muscles and the muscles to head turning and shoulder  shrug bilaterally.  Speech is well enunciated, not aphasic.  Extraocular  movements are full.  Visual fields are full.  Motor testing reveals fairly  good strength on all fours.  Good symmetric motor tone is noted throughout.  Sensory testing is intact to pin prick, soft touch, vibratory sensation  throughout.  The patient has fair finger nose to finger with the right upper  extremity, marked ataxia with the left upper extremity finger nose finger.  No true drift  is noted with the left upper extremity.  Patient has good heel  to shin bilaterally.  No drift is seen with the lower extremities.  Patient  was not ambulated.  Patient has symmetric reflexes.  Toes are neutral  bilaterally.  No extension was noted.  NIH stroke scale was 1.   CT of the head is as above.  Blood work is pending at this time.   IMPRESSION:  New onset of deficits involving left upper extremity ataxia.   This patient has had a sudden onset of left arm weakness, floppiness, and  mutism that has markedly improved to the point where patient is now able to  speak normally and has good strength to the left arm, but is left with some  significant ataxia and dysmetria of the left arm.  Patient scores an NIH  stroke scale of 1, has had evidence of marked improvement since onset of the  deficit and for this reason would not be a good TPA candidate even though he  presents at two hours.  Patient will need to be evaluated for possible  embolic shower versus brain stem type event.  No prior history of  hypertension, cerebrovascular disease, or other significant risk factors for  stroke.   PLAN:  1.  Patient will be admitted to [facility].  2.  MRI of the brain.  3.  MR angiogram of the intracranial/extracranial vessels.  4.  2-D echocardiogram.  5.  Aspirin therapy.  6.  IV fluid hydration.  Monitored bed.  Will follow patient's clinical      course while in-house.      Marlan Palau, M.D.  Electronically Signed     CKW/MEDQ  D:  04/16/2005  T:  04/16/2005  Job:  [id number]   cc:   Teena Irani. Arlyce Dice, M.D.  Fax: [phone]

## 2010-11-08 NOTE — Consult Note (Signed)
NAME:  Gerald Dyer, Gerald Dyer NO.:  [id number]   MEDICAL RECORD NO.:  [id number]          PATIENT TYPE:  INP   LOCATION:  3039                         FACILITY:  [facility]   PHYSICIAN:  Cherylynn Ridges, M.D.    DATE OF BIRTH:  03-27-1925   DATE OF CONSULTATION:  DATE OF DISCHARGE:                                   CONSULTATION   REASON FOR CONSULTATION:  Sigmoid colon tumor.   HISTORY OF PRESENT ILLNESS:  Gerald Dyer is a 77 year old male patient who  was admitted six days ago with a right frontal lobe and anterior right  parietal lobe infarction.  He showed a large PFO with bidirectional flow,  positive bubble study, moderate AI plus a 5.5 cm ascending aortic aneurysm.  He was seen in consultation for the above by Dr. Evelene Croon who  recommended surgical intervention.  The patient is to follow up in his  office in the next several weeks, though that surgery can be definitely  scheduled and performed.   Also, on admission, the patient's hemoglobin was 9, he was diagnosed with  microcytic anemia and was found to be iron deficient.  Hemoccult negative.  Ultimately, a colonoscopy on April 22, 2005, showed a sigmoid colon tumor,  length 4-5 cm, with partial obstruction at 12 to 15 cm.  We are asked to  evaluate the patient for resection.   ALLERGIES:  Codeine.   MEDICATIONS:  Zoloft, Protonix, Aggrenox, Lovenox.   SOCIAL HISTORY:  Widowed for 18 months, retired, lives in [place], he has  three sons.  He is a previous smoker.   FAMILY HISTORY:  Mother died from unknown reasons.  Father died from old  age.   PAST MEDICAL HISTORY:  History of nasal fracture with surgical repair.   LABORATORY DATA:  Hemoglobin 8.4, hematocrit 27, white count 7.3, platelets  354.  BMP normal with normal LFTs.  Iron count less than 10, ferritin level  9.  Other x-ray studies included chest CT with 7 mm left lower lobe nodule  as well as pleural based nodules.  There was a 3.5 by 2.5 cm  gallstones in  the neck of the gallbladder with gallbladder decompression, asymptomatic.   PHYSICAL EXAMINATION:  VITAL SIGNS:  Blood pressure 151/72, pulse 76, respirations 16, temperature  97.7.  HEENT:  Within normal limits.  Sclerae clear, conjunctivae normal.  Nares  without drainage.  NECK:  No carotid or subclavian bruits, no JVD, no thyromegaly.  CHEST:  Clear to auscultation bilaterally, no wheezing or rhonchi.  HEART:  Regular rate and rhythm with a 1/6 systolic murmur.  ABDOMEN:  Soft, nontender, nondistended, good bowel sounds.  EXTREMITIES:  No peripheral edema.  SKIN:  Warm and dry.   ASSESSMENT AND PLAN:  1.  Sigmoid mass with partial obstruction.  2.  Iron deficiency anemia.  3.  PFO.  4.  Ascending aortic aneurysm.  5.  Bilateral ICA stenosis (40-60%).  6.  CVA as described above.  7.  Depression.  8.  Former smoker.   The patient has been seen and examined by Dr. Lindie Spruce.  We would like  to  proceed with resection on Friday, April 25, 2005, however, need to discuss  cardiac status with [facility] as well as Dr.  Laneta Simmers from [facility] to see whether or not the patient needs to have a cardiac  catheterization done prior to this surgery or to see if there is any  contraindication from the patient's known PFO/aortic insufficiency/ascending  aortic aneurysm.  I have called both services to get their input.  I will  continue to follow the patient closely during the next two days to assure  clearance from both services.  Tentatively, we would like to proceed with  resection on April 25, 2005.      Guy Franco, P.A.      Cherylynn Ridges, M.D.  Electronically Signed    LB/MEDQ  D:  04/23/2005  T:  04/23/2005  Job:  [id number]   cc:   Evelene Croon, M.D.  [address] [place] [address]  [place]  [place] [address]   Cristy Hilts. Jacinto Halim, MD  Fax: [phone]   Pramod P. Pearlean Brownie, MD  Fax: [phone]   Teena Irani. Arlyce Dice, M.D.  Fax: [phone]

## 2010-11-08 NOTE — Discharge Summary (Signed)
NAME:  Gerald Dyer, Gerald Dyer NO.:  [id number]   MEDICAL RECORD NO.:  [id number]          PATIENT TYPE:  INP   LOCATION:  3039                         FACILITY:  [facility]   PHYSICIAN:  Annie Main, N.P.      DATE OF BIRTH:  [birth year]   DATE OF ADMISSION:  04/16/2005  DATE OF DISCHARGE:                                 DISCHARGE SUMMARY   Audio too short to transcribe (less than 5 seconds)      Annie Main, N.P.     SB/MEDQ  D:  05/02/2005  T:  05/02/2005  Job:  [id number]

## 2010-11-08 NOTE — Discharge Summary (Signed)
NAME:  Gerald Dyer, Gerald Dyer NO.:  [id number]   MEDICAL RECORD NO.:  [id number]          PATIENT TYPE:  INP   LOCATION:  2028                         FACILITY:  [facility]   PHYSICIAN:  Evelene Croon, M.D.     DATE OF BIRTH:  07-30-1924   DATE OF ADMISSION:  08/20/2005  DATE OF DISCHARGE:  08/23/2005                                 DISCHARGE SUMMARY   PRIMARY ADMISSION DIAGNOSIS:  Shortness of breath.   ADDITIONAL/DISCHARGE DIAGNOSES:  1.  Shortness of breath.  2.  Status post aortic valve replacement and repair of ascending aortic      aneurysm with Bentall procedure using a 27-mm Medtronic free-style      aortic root/valve on August 04, 2005.  3.  Status post closure of patent foramen ovale.  4.  History of depression.  5.  History of hearing loss.  6.  History of colon cancer, status post sigmoid colon resection,      cholecystectomy for colon cancer, and gallstones.  7.  History of chronic obstructive pulmonary disease.  8.  Right internal carotid artery stenosis, 40% to 60%.  9.  Hypertension.   HISTORY:  The patient is a 77 year old male who was status post Bentall  procedure on August 04, 2005, performed by Dr. Laneta Simmers. His postoperative  course was complicated by debilitation secondary to age and severe COPD. He  was eventually discharged home on August 13, 2005, on home O2. He had been  progressing well up until the date of this admission at which point he awoke  with acute dyspnea and some chest wall discomfort. He presented to the  emergency department for further evaluation. A chest x-ray showed no acute  abnormality. The CT angiogram of the chest showed a small amount of fluid  collecting around the ascending aortic graft which was considered a normal  postoperative change. There was also a tiny anterior pericardial effusion  and minimal pleural fluid, and no acute pulmonary embolism. EKG showed  normal sinus rhythm with inferior and anterolateral ST-T  wave abnormalities.  He was seen by Dr. Laneta Simmers and it was felt that he should be admitted for  further observation and workup.   HOSPITAL COURSE:  The patient was admitted and was treated conservatively.  He was seen by cardiology and was ruled out for an acute  myocardial  infarction with cardiac enzymes and a troponin which was negative. He was  found to be mildly anemic on admission, however this was stable and he had  been discharged home previously on iron supplementation. During his  hospitalization he has remained stable and his dyspnea is improving. He was  maintaining O2 saturations of 90% on room air, and has otherwise remained  stable. He is scheduled for an echocardiogram on August 22, 2005. His most  recent labs showed hemoglobin of 10.2, hematocrit 30.9, white count 8.0,  platelet count 441,000. Sodium 137, potassium 4.1, BUN 13, and creatinine  1.0.  No obvious source for his shortness of breath was found and he  continues to improve. He will be evaluated on morning rounds on August 23, 2005,  and hopefully if he is remaining stable and his O2 saturations have  continued to remain in an 89% to 90% range on room air and he is otherwise  doing well. He will hopefully be ready for discharge home at that time. The  only adjustment in his medications during his hospitalization was a slight  increase in his ACE inhibitor by cardiology.   DISCHARGE MEDICATIONS AS FOLLOWS:  1.  Aspirin 325 mg daily.  2.  Lopressor 25 mg t.i.d.  3.  Lisinopril 20 mg q.h.s.  4.  Folic acid 1 mg daily.  5.  Zoloft 50 mg daily.  6.  Combivent two puffs q.i.d.  7.  Iron 150 mg b.i.d.  8.  Ultram 50-100 mg q.4-6h. p.r.n. pain.   DISCHARGE INSTRUCTIONS:  He is asked to refrain from driving, heavy lifting,  or strenuous activity. He may continue ambulating daily and using his  incentive spirometry. He may shower daily and clean his incisions with soap  and water. He will continue his same preoperative  diet.   DISCHARGE FOLLOWUP:  He will make an appointment to see Dr. Jacinto Halim in the  next two weeks. He will then follow up with Dr. Laneta Simmers on March 20th at  12:45 p.m. He will have chest x-ray prior to this appointment at [facility]. He will call in the interim if he experiences problems or  has questions.      Coral Ceo, P.A.      Evelene Croon, M.D.  Electronically Signed    GC/MEDQ  D:  08/22/2005  T:  08/22/2005  Job:  [id number]   cc:   Teena Irani. Arlyce Dice, M.D.  Fax: [phone]   Cristy Hilts. Jacinto Halim, MD  Fax: [phone]

## 2010-11-08 NOTE — H&P (Signed)
NAME:  Gerald Dyer, Gerald Dyer NO.:  [id number]   MEDICAL RECORD NO.:  [id number]          PATIENT TYPE:  INP   LOCATION:  2028                         FACILITY:  [facility]   PHYSICIAN:  Gerald Dyer, M.D.     DATE OF BIRTH:  1925-03-09   DATE OF ADMISSION:  08/20/2005  DATE OF DISCHARGE:                                HISTORY & PHYSICAL   REASON FOR ADMISSION:  Dyspnea at rest.   HISTORY OF PRESENT ILLNESS:  This patient is a 77 year old gentleman, well  known to me status post aortic valve replacement and repair of an ascending  aortic aneurysm measuring 6 cm in diameter with a Bentall procedure using a  27 mm Medtronic free-style aortic root heart valve on August 04, 2005.  He  also had closure of a large patent foramen ovale.  His postoperative course  was slow due to his age, debilitation, and severe COPD and chronic lung  disease.  He was eventually discharged home on August 13, 2005, on nasal  cannula O2 at 1 to 2 liters per minute.  His son says he has been doing well  at home and making progress daily, but the son called me this morning and  said the patient awoke this morning with acute shortness of breath, some  mild chest wall discomfort anteriorly, and tachypnea.  I told them to come  to the emergency room for further workup.   REVIEW OF SYSTEMS:  GENERAL: He denies any fever or chills.  He has been  eating well.  He reports persistent fatigue since going home.  EYES:  Negative.  ENT: Negative.  ENDOCRINE: He has no history of diabetes or  hypothyroidism. CARDIOVASCULAR: He denies any chest pain or pressure with  exertion.  He has had some mild chest wall soreness.  He has some exertional  dyspnea but has not been walking too much at home. He denies PND or  orthopnea.  He has had no palpitations.  RESPIRATORY:  He denies cough.  He  has had minimal sputum production and has been clear.  He denies hemoptysis.  GI: He has had no nausea or vomiting.  He denies  dysphagia.  He denies  melena and hematochezia.  GU: Denies dysuria and hematuria. MUSCULOSKELETAL:  He denies arthralgias and myalgias. VASCULAR: Negative. NEUROLOGIC: He  denies any focal weakness or numbness.  He denies any dizziness or syncope.   PAST MEDICAL HISTORY:  Significant for:  1.  Cardiac surgery as mentioned above.  2.  He is status post sigmoid colon resection and cholecystectomy on      April 25, 2005, for a partially obstructing colon cancer and      gallstones.  3.  He is status post complete dental extraction prior to undergoing cardiac      surgery.  4.  History of hearing loss and uses bilateral hearing aids.  5.  History of nasal fracture with surgical repair in the past.  6.  History of depression.  He is on Zoloft.   MEDICATIONS:  1.  Aspirin 325 mg daily.  2.  Lopressor 25 mg p.o.  3 times a day.  3.  Lisinopril 10 mg daily.  4.  Zoloft 50 mg daily.  5.  Iron 150 mg twice daily.  6.  Folic acid 1 mg daily.  7.  Combivent 2 puffs 4 times a day.  8.  He has been taking Tylenol p.r.n.   ALLERGIES:  CODEINE.   SOCIAL HISTORY:  He is retired and widowed.  He lives in [place] by  himself.  He has 3 sons who have been caring for him since discharge.  He  quite smoking in 1990.   FAMILY HISTORY:  His father died of old age, and his mother died of unknown  reasons.  He has a sister who is alive and well.   PHYSICAL EXAMINATION:  VITAL SIGNS: Blood pressure 110/57, temperature 97,  pulse 88 and regular, respiratory rate on presentation was 40.  Respiratory  rate now is 22. Oxygen saturation on presentation was 97% on 2 liters.  It  is 99% at this time.  GENERAL:  He is an elderly white male who does not appear his usual self and  is somewhat anxious.  HEENT: Normocephalic and atraumatic.  Pupils equal, round, and reactive to  light and accommodation.  Extraocular muscles are intact.  Throat is clear.  He is edentulous.  NECK:  Normal carotid pulses  bilaterally.  There are no bruits.  There is no  adenopathy or thyromegaly.  CARDIAC:  Regular rate and rhythm with normal S1 and S2. There is no murmur.  LUNGS: Clear.  ABDOMEN:  Active bowel sounds.  Abdomen is soft and nontender.  There are no  masses or organomegaly.  There is a well-healed laparotomy scar.  EXTREMITIES:  No peripheral edema.  Pedal pulses are palpable bilaterally.  SKIN:  Warm and dry.  NEUROLOGIC: Alert and oriented x3. Motor and sensory exams are grossly  normal.   LABORATORY DATA:  Normal electrolytes with a glucose of 78, BUN 13, and a  creatinine of 1. Hemoglobin was 10.2 with a hematocrit of 30.9, white blood  cell count 8, platelet count 441,000.  Differential is within normal limits.  CPK total was 82.9 with an MB of less than 1.  Troponin I was less than 0.05  which is negative.  His blood gas on presentation was 7.69, pCO2 of 17, pO2  of 85, bicarb 21, oxygen saturation 99%.   Chest x-ray examination shows COPD of chronic changes.  There is no acute  abnormality.   CT angiogram of the chest shows the ascending aortic graft with a small  amount of fluid around it as expected.  There is a tiny anterior pericardial  effusion as expected.  There is minimal pleural fluid an no acute lung  abnormality.  There is no pulmonary embolism.   Electrocardiogram today shows normal sinus rhythm with inferior and  anterolateral ST-T wave abnormality compared to his previous EKG of February  13 which showed nonspecific ST-T wave abnormalities.   IMPRESSION:  [name] has acute onset of dyspnea and tachypnea this  morning of unclear etiology.  It is clearly better at this time.  All of his  diagnostic procedures today have been within normal limits.  His  electrocardiogram does show some changes compared to his immediate  postoperative electrocardiogram, but the significance of this is unclear. His enzyme levels are negative.  He reports only mild chest wall  incisional  type discomfort.  He does have severe chronic obstructive pulmonary disease  and was sent home  on oxygen.  It is possible he had some chest wall  discomfort his morning and began to get tachypneic and short of breath with  that.  He has had panic attacks before.   Given his advanced age and fragility as well as severe chronic obstructive  pulmonary disease, I think it would be safest to admit him to the hospital  for close observation and localization as well as pulmonary toilet.  If he  improves over the next couple of days, we can send him back home.  I  discussed this with his family and patient. They understand and agree with  this plan.      Gerald Dyer, M.D.  Electronically Signed     BB/MEDQ  D:  08/20/2005  T:  08/20/2005  Job:  [id number]

## 2010-11-08 NOTE — Op Note (Signed)
NAME:  Gerald Dyer, Gerald Dyer NO.:  [id number]   MEDICAL RECORD NO.:  [id number]          PATIENT TYPE:  INP   LOCATION:  2302                         FACILITY:  [facility]   PHYSICIAN:  Evelene Croon, M.D.     DATE OF BIRTH:  [birth year]   DATE OF PROCEDURE:  DATE OF DISCHARGE:                                 OPERATIVE REPORT   PREOPERATIVE AND POSTOPERATIVE DIAGNOSIS:  Ascending aortic aneurysm with  severe aortic insufficiency and a large patent foramen ovale.   OPERATIVE PROCEDURE:  Median sternotomy, extracorporeal circulation,  replacement of aortic valve and the ascending aortic aneurysm using a 27 mm  Medtronic freestyle aortic root heart valve prosthesis with reimplantation  of the right and left coronary arteries, closure of patent foramen ovale.   ATTENDING SURGEON:  Dr. Evelene Croon.   ASSISTANT:  Constance Holster, PA   ANESTHESIA:  General endotracheal.   CLINICAL HISTORY:  This patient is a 77 year old gentleman who was admitted  October 2006 with sudden onset of mutism and left-sided weakness. His  symptoms improved markedly after admission. CT scan of brain was  unremarkable. 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 An MR  angiogram showed no significant arterial stenosis. Carotid Doppler showed a  40-60% internal carotid artery stenosis bilaterally.  He subsequently  underwent a transesophageal echocardiogram to look for intracardiac thrombus  and this showed a normal left atrial appendage without clot. There is a  large patent foramen ovale with bidirectional shunt with a positive bubble  study. This was measured at 6 to 7 mm. He had mild mitral regurgitation.  There was a 5-5.5 cm saccular ascending aortic aneurysm involving the root  with moderately severe aortic insufficiency. The aortic arch and descending  thoracic aorta appeared normal. He subsequently underwent CT scan of the  chest which showed a 5.5-cm ascending aortic aneurysm  involving the aortic  root and proximal ascending aorta. This scan also showed extensive  emphysematous changes with biapical pleural parenchymal scarring as well as  bronchiectasis of the left base. There is a 7 mm left lower lobe lung nodule  and two pleural based 6 mm nodules in the left lower lobe. He was also noted  to be anemic at his initial presentation with heme-positive stools and  workup showed a partially obstructing sigmoid colon cancer that was treated  with resection. This was a T3, N0, MX lesion. There was no tumor identified  in six pericolonic lymph nodes. He subsequently underwent cardiac  catheterization showing no significant coronary disease. There was mild left  ventricular dysfunction with ejection fraction of about 40% with moderate  aortic insufficiency. He was seen by dental medicine and subsequently  underwent extraction of all his remaining teeth which were in poor condition  in January 2006. He is felt be ready for surgery. I saw him in the office  and discussed the operative procedure of replacement of his aortic valve and  ascending aortic aneurysm with him and his sons. We discussed alternatives,  a mechanical valve prosthesis versus a homograft or porcine aortic root.  After multiple discussions we decided that  a porcine root or homograft would  be the best choice so that he would not need to be anticoagulated. We also  discussed closure of patent foramen ovale. I discussed the benefits and  risks including bleeding, blood transfusion, infection, stroke, myocardial  infarction, organ failure, and death. He understood and agreed to proceed.   OPERATIVE PROCEDURE:  The patient was taken to the operating room, placed on  table in \\supine  position. After induction of general endotracheal  anesthesia, a Foley catheter was placed in bladder using sterile technique.  Then the chest, abdomen and both lower extremities were prepped and draped  in usual sterile  manner.  The chest, abdomen and both lower extremities were  prepped and draped in usual sterile manner. The transesophageal  echocardiogram was performed. This showed a free leaflet aortic valve with  severe aortic insufficiency and a 5.5 cm aortic root aneurysm. There was  marked dilatation of the aortic sinuses. Left ventricular function appeared  well-preserved.. There was trivial mitral regurgitation.   Then the chest was opened through a median sternotomy incision. The  pericardium opened in the midline. Examination of the heart showed good  ventricular contractility. Examination of the ascending aorta showed that  there was an aortic root aneurysm. The aorta tapered back down to a more  normal caliber in the distal ascending aorta. He had a fairly long aorta and  I felt there was adequate room to cannulate his aortic arch and to be able  to cross clamp just proximal to the innominate takeoff to avoid hypothermic  circulatory arrest. Then the patient was heparinized. When an adequate  activated clotting time was achieved, the proximal aortic arch was  cannulated using a 20-French aortic cannula for arterial inflow. Venous  outflow was achieved using bicaval venous cannulation with a 28-French metal  tipped right angle cannula placed through a pursestring suture in the  superior vena cava and a 36-French plastic cannula placed through a  pursestring suture in the low right atrium. An antegrade cardioplegia and  vent cannula was inserted in aortic root. A left ventricular vent was placed  through the right superior pulmonary vein. Retrograde cardioplegic cannula  was inserted through the right atrium and the coronary sinus.   The patient placed on cardiopulmonary bypass. The aorta was crossclamped and  500 mL of cold blood retrograde cardioplegia was administered with quick  arrest the heart. Systemic hypothermia to 20 degrees centigrade and topical hypothermic with iced saline was  used. Temperature probe placed in septum  and insulating pad  in the pericardium. Additional doses of retrograde  cardioplegia were given about 20 minute intervals to maintain myocardial  temperature around 10 degrees centigrade. In addition after the coronaries  were exposed, cardioplegia was directly given into the right coronary ostia  every 20 minutes to maintain good right ventricular cooling.   Then the aorta was opened transversely. Examination of the native valve  showed there was a three leaflet valve that was grossly incompetent due to  dilatation of the aortic sinuses. The right and left coronary ostia were  dissected as buttons. Care was taken to maintain proper orientation. Then  the native valve was excised. The excess aortic tissue was removed. The  annulus was sized and a 27 mm Medtronic freestyle aortic root heart valve  was chosen. This had model number 995, serial number [id number]. Then a  series of 4-0 Ethibond sutures were placed around the aortic annulus. This  required approximately 30 sutures. Then the  valve was prepared according to  the manufacturer's specifications. The sutures were then placed through the  sewing cuff and the valve lowered into place. The sutures were tied  sequentially. A small piece of pericardium was used as a gusset to tie over.  The valve seated nicely. The proximal anastomosis was reinforced with a  small amount of BioGlue for hemostasis. It should be noted that the porcine  root was rotated 120 degrees clockwise so that the left coronary ostia of  the porcine root was adjacent to the patient's right coronary ostia and the  right coronary ostia of the porcine root was located in the patient's  noncoronary sinus region. The coronary ostia on the porcine right sinus was  suture ligated with 3-0 Prolene suture.   Then a 5 mm opening was made in the noncoronary sinus of the porcine root.  The left coronary button from the patient was then  sewn directly to this in  end-to-side manner using continuous 5-0 Prolene suture. This anastomosis was  also coated lightly with BioGlue for hemostasis.   Then the left coronary ostia of the porcine root was cut just below the  ligating suture. The patient's right coronary button was then anastomosed to  this using continuous 5-0 Prolene suture in end-to-side manner. This  anastomosis was also coated with a light amount of BioGlue.   Then a 30 mm Hemashield graft was cut to the appropriate length. It was  anastomosed to the end of the porcine root in an end-to-end manner using  continuous 3-0 Prolene suture. This anastomosis was coated with BioGlue. The  graft was then cut to appropriate length and anastomosed to the distal end  of the aorta in end-to-end manner using continuous 3-0 Prolene suture with  felt strip to reinforce anastomosis. This anastomosis was also coated with  BioGlue.  Then attention was turned to closure of the PFO. The right atrium was opened  through a oblique incision. There was a large PFO that measured at least 6-7  mm in diameter. The surrounding tissue was fairly redundant and I felt that  primary closure would be possible without tension. This was then closed  using three interrupted sutures of 3-0 Prolene in a horizontal mattress  fashion. This resulted in complete closure of the PFO. The patient then  rewarmed 37 degrees centigrade. The atriotomy was closed in two layers using  continuous 4-0 Prolene suture. Then the left side heart was de-aired. The  crossclamp was then removed with time of 184 minutes. The patient was  defibrillated into sinus rhythm.   The proximal and distal anastomoses appeared hemostatic. Two temporary right  ventricular and right atrial pacing wires were placed and brought out  through the skin. When the patient rewarmed 37 degrees centigrade, he was  weaned from cardiopulmonary bypass on low-dose dopamine. Total bypass time  was  219 minutes. Cardiac function appeared excellent. A transesophageal  echocardiogram showed normal functioning porcine root with no insufficiency.  Then protamine was given and the venous and aortic cannulae removed without  difficulty. Hemostasis was achieved. The patient did appear to have some  coagulopathy and was given 10 units of platelets due to persistent oozing  from needle holes. He had been on Aggrenox preoperatively. When I felt there  was adequate hemostasis, two chest tubes were placed through separate stab  incisions, one positioned in the posterior pericardium and one in the  anterior mediastinum. The sternum was then closed with #6 stainless steel  wires.  Fascia was closed with continuous #1 Vicryl suture. Subcu tissue was  closed with continuous 2-0 Vicryl and skin with a 3-0 Vicryl subcuticular  closure. The sponge, needles and counts correct according to scrub nurse.  Dry sterile dressing applied over the incision around the chest tubes which  were Pleur-Evac suction. The patient remained hemodynamically stable,  transferred to the SICU in guarded but stable condition.      Evelene Croon, M.D.  Electronically Signed     BB/MEDQ  D:  08/04/2005  T:  08/05/2005  Job:  [id number]   cc:   Cristy Hilts. Jacinto Halim, MD  Fax: [phone]   Cardiac cath lab

## 2010-11-08 NOTE — Discharge Summary (Signed)
NAME:  Gerald Dyer, Gerald Dyer NO.:  [id number]   MEDICAL RECORD NO.:  [id number]          PATIENT TYPE:  INP   LOCATION:  2023                         FACILITY:  [facility]   PHYSICIAN:  Evelene Croon, M.D.     DATE OF BIRTH:  04/24/1925   DATE OF ADMISSION:  08/04/2005  DATE OF DISCHARGE:                                 DISCHARGE SUMMARY   PRIMARY DIAGNOSIS:  Ascending aortic aneurysm with severe aortic  insufficiency and a large patent foramen ovale.   HOSPITAL DIAGNOSIS:  1.  Acute blood loss anemia postoperatively.  2.  Postoperative volume overload.  3.  COPD.   SECONDARY DIAGNOSES:  1.  History of coronary artery disease.  2.  History of depression.  3.  History of nasal fractures, surgical repair in the past.  4.  History of recent colon resection for colon cancer.  5.  Status post complete dental extraction.  6.  Status post cholecystectomy.  7.  History of hearing loss and use of hearing aids.   ALLERGIES:  CODEINE.   IN HOSPITAL OPERATIONS AND PROCEDURES:  1.  Replacement of aortic valve and ascending aortic aneurysm using a 27 mm      Medtronic free style aortic root heart prosthesis with re-implantation      of the right and left coronary arteries, closure of patent foramen      ovale.  2.  Transesophageal echocardiogram.   HISTORY AND PHYSICAL AND HOSPITAL COURSE:  The patient is a 77 year old  gentleman who was admitted on October 2006 with sudden onset of mutism and  left-sided weakness. His symptoms improved markedly after admission.  CT  scan of the brain was unremarkable.  MRI showed a small posterior parietal  acute infarct.  An MR angiogram showed no significant arterial stenosis.  Carotid Doppler showed 40-60% internal carotid artery stenosis bilaterally.  The patient subsequently underwent transesophageal echocardiogram to look  for intracardiac thrombus.  This showed a normal left atrial appendage  without clot.  There was large patent  foramen ovale with a bidirectional  shunt with a positive bubble study.  This was measured to be 6-7 mm.  The  patient had mild mitral regurgitation.  There was a 5 to 5.5 cm saccular  ascending aortic aneurysm involving the root with moderately severe aortic  insufficiency.  The aortic arch and descending thoracic aorta appeared  normal.  The patient subsequently underwent CT scan of his chest which  showed a 5.5 cm ascending aortic aneurysm involving the aortic __________  and proximal ascending aorta.  The scan also showed extensive emphysematous  changes as well as apical pleural parenchymal scarring as well as  bronchiectasis of the left base.  There is a 7 mm left lower lobe lung  nodule and 2 pleural based 6 mm nodules in the left lower lobe.  The patient  then subsequently underwent cardiac catheterization showing no significant  coronary artery disease.  There is mild left ventricular dysfunction with  ejection fraction of about 40% with moderate aortic insufficiency.  The  patient seemed by dental medicine and subsequently underwent extraction of  all  of his remaining teeth which were poor condition in January 2006.  The  patient was seen and evaluated by Dr. Laneta Simmers.  Dr. Laneta Simmers discussed the  patient undergoing surgery.  He discussed the risks and benefits of the  surgery.  The patient acknowledges understanding and agrees to proceed.  Surgery was scheduled for August 04, 2005.   The patient was taken to the operating room August 04, 2005, where he  underwent replacement of aortic valve and ascending aortic aneurysm using a  27 mm Medtronic free style aortic root heart valve prosthesis.  Three  implantation of the right and left coronary arteries and closure of the  patent foramen ovale.  The patient tolerated this procedure well and was  transferred up to the intensive care unit in stable condition.  The patient  was extubated the evening of surgery.  He seemed to be alert  and oriented x  3, neurally intact following extubation.   The patient's postoperative course was complicated by acute blood loss  anemia.  This required blood transfusion.  His hemoglobin and hematocrit  dropped to 8.5 and 24.9.  9.2 and 26.9 on August 08, 2005.  The patient  was continued on folic acid and p.o. iron.  He was asymptomatic from the  acute blood loss.  The patient also developed volume overload.  He was  started on diuretics.   Postoperative day 7, February 19, the patient was seen to be one pound above  his preoperative weight.  Lasix and potassium were D/C'd at that time.  Also  postoperatively the patient had difficulty weaning from the oxygen.  He  would desat down to the 80's.  The patient does have a history of tobacco  use.  He was started on nebulizer inhalers postoperatively.   Postoperative day 7, the patient was started on Combivent inhaler 4 x day.  Attempt to wean oxygen was continued during his hospital stay.  Postoperative day 7, the patient was sating 97% on 2 liters.  Was unable to  wean oxygen to room air keeping sats varied at 90%.  The plan is to D/C  patient home on oxygen.  Will attempt to see how patient does on room air  today.   The remainder of the patient's postoperative course is pretty much  unremarkable.  Chest tubes and lines are D/C'd in the normal fashion.  He  was out of bed and ambulating well.  The patient's incision was dry and  intact and healing well.  The patient was transferred out to 2000 on  postoperative day 2.  The patient remained in normal sinus rhythm  postoperatively.  The patient remained afebrile during his postoperative  course.  Vital signs were monitored.  During his postoperative course and  p.o. medications were adjusted appropriately.  The patient is tolerating  diet well prior to discharge home.  No nausea, vomiting, dysphagias.   The patient is tentatively ready for discharge home postoperative day  8, August 12, 2005.  Again, pending if patient is able to be weaned off  oxygen, the possibility for him to be D/C'd home on home O2 if room air sats  are not greater than 90%.  Follow-up CBC has been ordered for the a.m.  A  follow-up appointment has been scheduled with Dr. Laneta Simmers on August 26, 2005 at  12:15 p.m.  The patient is to obtain a PA and lateral chest x-ray one hour  prior to this appointment.  The patient is to follow  up with Dr. Jacinto Halim in 2  weeks.   Mr. Monarch received instructions on diet, activity, incisional care.  He  was told no driving until released to do so.  No heavy lifting over 10  pounds.  The patient was told he was allowed to shower, washing his incision  using soap and water.  He is to contact the office if he develops drainage  or openings from any of his incision sites.  He acknowledges understanding.  The patient was told to ambulate 3-4 x per day as tolerated and to continue  his breathing exercises.  The patient is educated on diet, low fat and low  salt.   DISCHARGE MEDICATIONS:  1.  Aspirin 325 mg daily.  2.  Lopressor 25 mg t.i.d.  3.  Lisinopril 10 mg daily.  4.  Zoloft 50 mg daily.  5.  Folic acid 1 mg daily.  6.  Iron 150 mg b.i.d.  7.  Combivent inhaler 2 puffs q.i.d.  8.  Plavix 160 tablets q.4-6 hours p.r.n. pain.      Theda Belfast, [place]      Evelene Croon, M.D.  Electronically Signed    KMD/MEDQ  D:  08/11/2005  T:  08/11/2005  Job:  [id number]   cc:   Evelene Croon, M.D.  [id number]  [place]  [place] [address]   Cristy Hilts. Jacinto Halim, MD  Fax: [phone]

## 2010-11-08 NOTE — Op Note (Signed)
NAME:  WILBORN, MEMBRENO NO.:  [id number]   MEDICAL RECORD NO.:  [id number]          PATIENT TYPE:  INP   LOCATION:  2302                         FACILITY:  [facility]   PHYSICIAN:  Bedelia Person, M.D.        DATE OF BIRTH:  07/11/1924   DATE OF PROCEDURE:  08/04/2005  DATE OF DISCHARGE:                                 OPERATIVE REPORT   PROCEDURE PERFORMED:  Transesophageal echocardiogram.   Mr. Zapata is a 77 year old male with a known ascending thoracic aortic  aneurysm scheduled for repair under general anesthesia.  The TEE will be  used intraoperatively to assess the successful repair.  The patient has no  history of gas or esophageal medical complications.  He has been n.p.o. and  understands the risks and benefits of the use of the transesophageal probe  intraoperatively.   The patient was induced with general anesthesia.  The airway was secured  with an oral endotracheal tube.  The transesophageal probe was heavily  lubricated, placed in a sleeve.  The sleeve was lubricated and passed  blindly down the esophagus to the 45 cm mark.  There various views were  obtained, various Omniplane views were obtained throughout the procedure.  At the completion of the case, the probe was removed.  There was no evidence  of oral or pharyngeal damage.   Prebypass examination revealed the left ventricle to be somewhat thin in  appearance.  Contractility appeared normal.  There appeared to be no  segmental defects.  The left atrium was normal size and appearance.  The  appendage was clean. The mitral valve had thin, freely mobile leaflets.  There was no calcium noted.  The valve appeared to be coapting in the  valvular plane.  Color Doppler revealed a 1+ mitral regurgitant central jet  with significant aortic regurgitant flow noted under the anterior leaflet.  The aortic valve had three leaflets that opened and closed appropriately,  but did not coapt due to dilatation.  Color  Doppler revealed a moderate  central aortic regurgitation.  The aortic valve diameter was measured at 2.5  cm.  The widest margin of a significantly dilated ascending aorta was  measured at 6.5 cm and the length of the ascending aortic aneurysm was  estimated to be 6 to 7 cm ending prior to the arch vasculature.  The  interatrial septum appeared thin.  There was a noted left to right shunt  where a known __________ had  been diagnosed on a bubble study earlier.  The  right heart exam was essentially unremarkable.  Swan-Ganz catheter was noted  through the tricuspid valve which was exhibiting 1+ tricuspid regurge.  The  patient was placed on cardiopulmonary bypass and a dental procedure with  replacement of the ascending aorta into the aortic arch.  There was  revascularization of the coronary arteries and the replacement of the aortic  valve.  The patent foramen ovale was also closed.  At the completion of the  bypass period there were numerous air bubbles which were cleared.  Left  ventricular function was initially mildly depressed with hypokinesis  of the  septum and the inferior walls.  With a little dopamine, these improved  during the post bypass period to normal appearance.  The left atrium was  essentially unchanged except for a thickened interatrial septum.  Color  Doppler could not document any shunt.  Mitral valve was also unchanged in  appearance.  There continued to be a 1+ central mitral regurgitant flow.  The new tissue aortic valve had three leaflets that opened and closed  appropriately.  There was no aortic insufficiency.  Coronary arteries were  evident with Doppler documented flow and the right heart exam was  essentially unchanged.          ______________________________  Bedelia Person, M.D.    LK/MEDQ  D:  08/04/2005  T:  08/05/2005  Job:  [id number]

## 2010-11-16 ENCOUNTER — Encounter: Payer: Self-pay | Admitting: Internal Medicine

## 2010-11-26 ENCOUNTER — Inpatient Hospital Stay (INDEPENDENT_AMBULATORY_CARE_PROVIDER_SITE_OTHER)
Admission: RE | Admit: 2010-11-26 | Discharge: 2010-11-26 | Disposition: A | Discharge status: Home / Self Care | Payer: Medicare Other | Source: Ambulatory Visit | Attending: Emergency Medicine | Admitting: Emergency Medicine

## 2010-11-26 DIAGNOSIS — M7020 Olecranon bursitis, unspecified elbow: Secondary | ICD-10-CM

## 2010-11-26 LAB — SYNOVIAL CELL COUNT + DIFF, W/ CRYSTALS
Eosinophils-Synovial: 0 % (ref 0–1)
Lymphocytes-Synovial Fld: 15 % (ref 0–20)
Monocyte-Macrophage-Synovial Fluid: 77 % (ref 50–90)
Neutrophil, Synovial: 8 % (ref 0–25)
WBC, Synovial: 318 /mm3 — ABNORMAL HIGH (ref 0–200)

## 2010-11-27 ENCOUNTER — Telehealth: Payer: Self-pay | Admitting: *Deleted

## 2010-11-27 NOTE — Telephone Encounter (Signed)
[facility] Triage Call Report Triage Record Num: [id number] Operator: Sula Rumple Patient Name: Chimaobi Casebolt Call Date & Time: 11/26/2010 5:47:14PM Patient Phone: [phone] PCP: Evelena Peat Patient Gender: Male PCP Fax : [phone] Patient DOB: May 02, 1925 Practice Name: [facility] Reason for Call: Fonnie Birkenhead calling on 11/26/10 states pt has a swollen rt elbow/elbow is the size of a baseball/looks like fluid in it/pt is afebrile/seems uncomfortable/started today/area is red/advised to go to ER Protocol(s) Used: Elbow Non-Injury Recommended Outcome per Protocol: See Provider within 4 hours Reason for Outcome: New swelling with redness and pain or tenderness in or around joint Care Advice: ~ 11/26/2010 6:07:27PM Page 1 of 1 [email or web address]

## 2010-11-29 ENCOUNTER — Ambulatory Visit: Payer: Self-pay | Admitting: Family Medicine

## 2010-12-05 ENCOUNTER — Encounter: Payer: Self-pay | Admitting: Family Medicine

## 2010-12-06 ENCOUNTER — Ambulatory Visit (INDEPENDENT_AMBULATORY_CARE_PROVIDER_SITE_OTHER): Payer: Medicare Other | Admitting: Family Medicine

## 2010-12-06 ENCOUNTER — Encounter: Payer: Self-pay | Admitting: Family Medicine

## 2010-12-06 ENCOUNTER — Ambulatory Visit: Payer: Self-pay | Admitting: Family Medicine

## 2010-12-06 DIAGNOSIS — F329 Major depressive disorder, single episode, unspecified: Secondary | ICD-10-CM

## 2010-12-06 DIAGNOSIS — R413 Other amnesia: Secondary | ICD-10-CM

## 2010-12-06 DIAGNOSIS — I10 Essential (primary) hypertension: Secondary | ICD-10-CM

## 2010-12-06 DIAGNOSIS — J449 Chronic obstructive pulmonary disease, unspecified: Secondary | ICD-10-CM

## 2010-12-06 NOTE — Progress Notes (Signed)
  Subjective:    Patient ID: Gerald Dyer, male    DOB: [birth year], 77 y.o.   MRN: [id number]  HPI Patient has multiple problems including history of colon cancer, past history of anemia, depression, hypertension, history of subdural hematoma, COPD, GERD and some chronic insomnia. Recently evaluated for right olecranon bursitis. Seen emergency room. This was drained but some fluid reaccumulation. No evidence for infection. No crystals on fluid analysis. No pain. No history of trauma.  Some chronic insomnia. Frequent daytime naps. No caffeine use. No alcohol use. Not clear if taking lorazepam at night.  COPD. Consistent use of Spiriva. No recent cough or fever. Respiratory symptoms stable  History of falls. Currently using cane and family feels stronger.  All medications reviewed with family. He appears to be compliant. Dementia stable on Aricept. No agitation. Good appetite with about 12 pounds weight gain since last visit  Past Medical History  Diagnosis Date  . ADENOCARCINOMA, COLON, HX OF 09/21/2008  . ANEMIA-IRON DEFICIENCY 05/23/2009  . ANGIODYSPLASIA-INTESTINE 05/23/2009  . CHEST PAIN, PLEURITIC 11/21/2008  . COPD 02/01/2010  . DEPRESSION 09/21/2008  . FECAL OCCULT BLOOD 11/08/2009  . GERD 09/21/2008  . HYPERTENSION 11/01/2008  . INSOMNIA 04/23/2010  . Memory loss 01/08/2009  . 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 SUBDURAL HEMATOMA 09/21/2008  . Pulmonary collapse 06/11/2009  . UNSPECIFIED BACTERIAL PNEUMONIA 02/14/2010  . Aorta aneurysm    Past Surgical History  Procedure Date  . Cholecystectomy   . Aortic valve replacement     reports that he has quit smoking. He does not have any smokeless tobacco history on file. His alcohol and drug histories not on file. family history is not on file. Allergies  Allergen Reactions  . Codeine Sulfate       Review of Systems  Constitutional: Negative for fever and appetite change.  Respiratory: Negative for shortness of breath.     Cardiovascular: Negative for chest pain, palpitations and leg swelling.  Gastrointestinal: Negative for abdominal pain and blood in stool.  Genitourinary: Negative for dysuria.  Neurological: Negative for dizziness and headaches.  Psychiatric/Behavioral: Positive for confusion and sleep disturbance. Negative for hallucinations and agitation. The patient is not nervous/anxious.        Objective:   Physical Exam  Constitutional: He appears well-developed and well-nourished.  HENT:       Moderate cerumen right canal. Left is clear  Eyes: Pupils are equal, round, and reactive to light.  Neck: Neck supple.  Cardiovascular: Normal rate and regular rhythm.   Pulmonary/Chest: Effort normal and breath sounds normal. He has no rales.       Few very faint wheezes are noted  Musculoskeletal: He exhibits no edema.       Right elbow full range of motion. No erythema or warmth. Olecranon effusion. This is mild. Full range of motion  Lymphadenopathy:    He has no cervical adenopathy.  Skin: No rash noted.  Psychiatric: He has a normal mood and affect.          Assessment & Plan:  #1 right olecranon bursitis. Reassurance given.  No signs of infection. Have not elected further drainage at this time #2 hypertension stable #3 depression stable on sertraline #4 dementia. Continue Aricept #5 insomnia. Sleep hygiene discussed. Give lorazepam at night. Avoid daytime napping

## 2010-12-07 ENCOUNTER — Encounter: Payer: Self-pay | Admitting: Family Medicine

## 2011-01-30 IMAGING — CR DG CHEST 2V
3 series · 3 of 3 positions shown · non-contrast
Comparison: 02/07/2010

CLINICAL DATA: Pneumonia, cough, shortness of breath.  Emphysema.

CHEST - 2 VIEW

[view not recorded (1 of 3)]
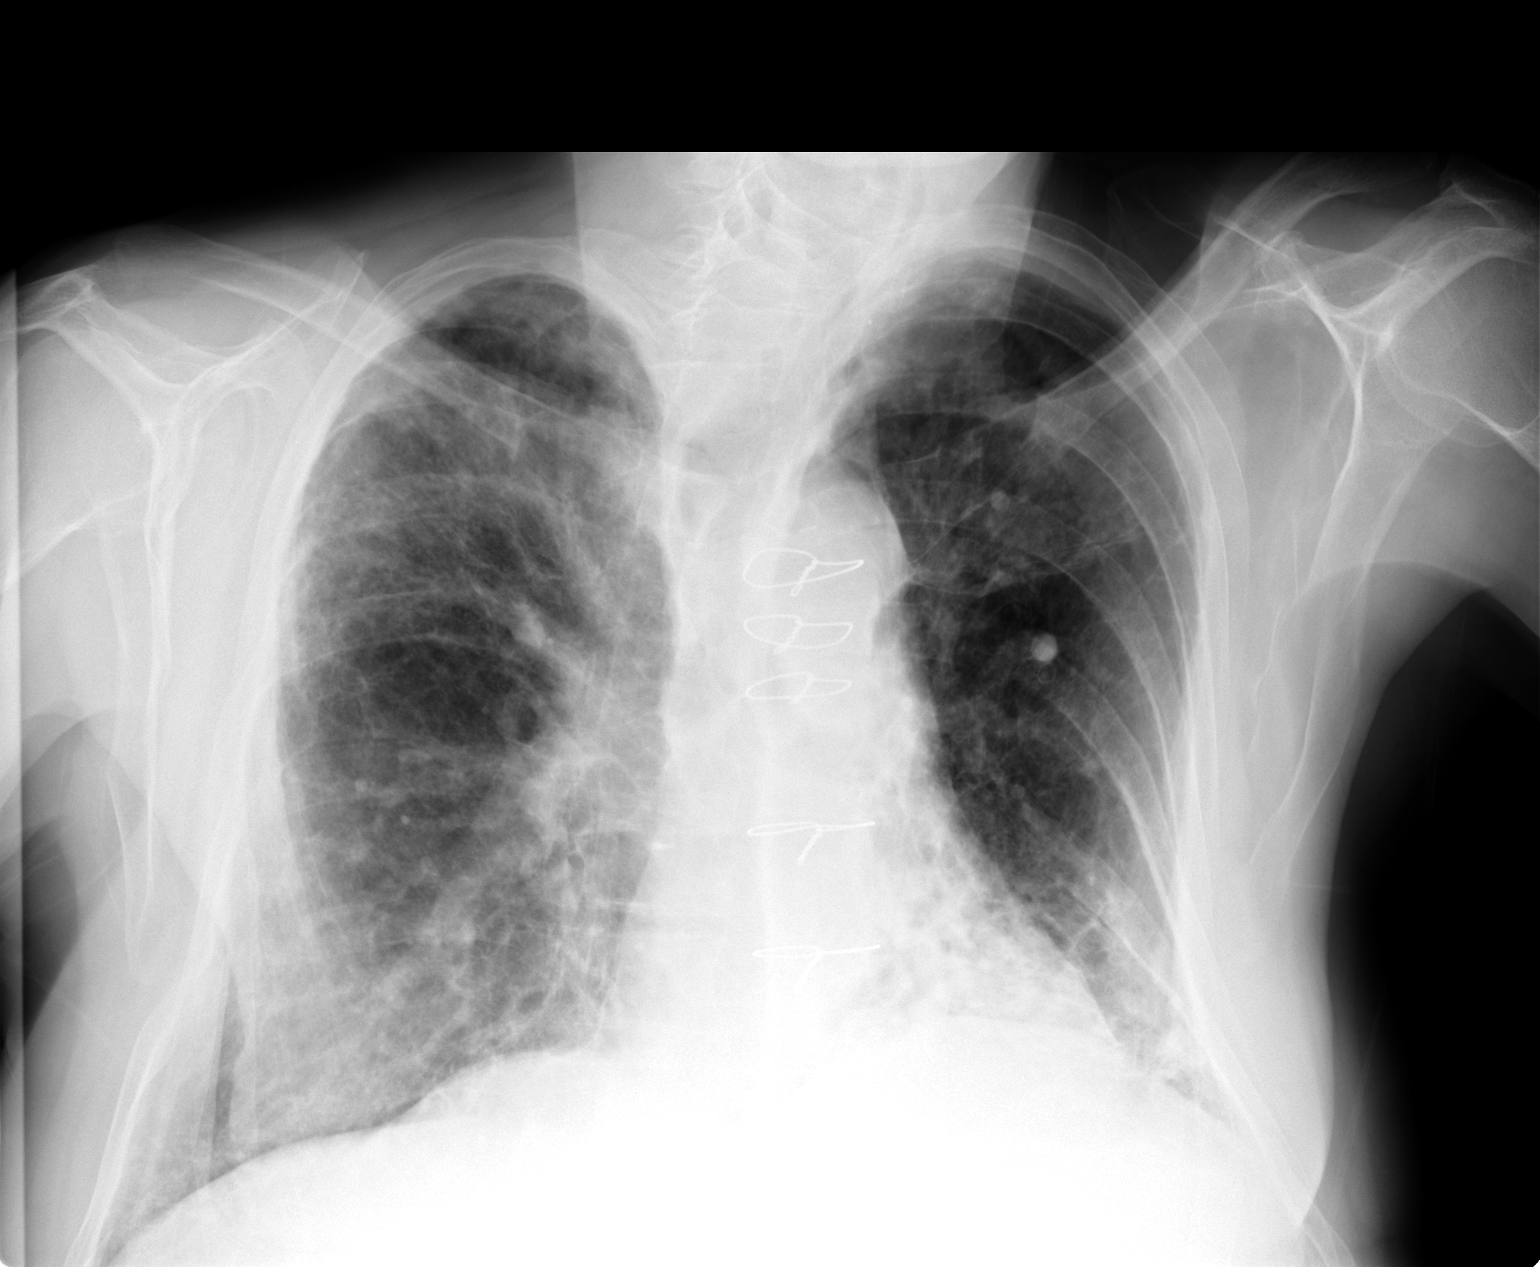

[view not recorded (2 of 3)]
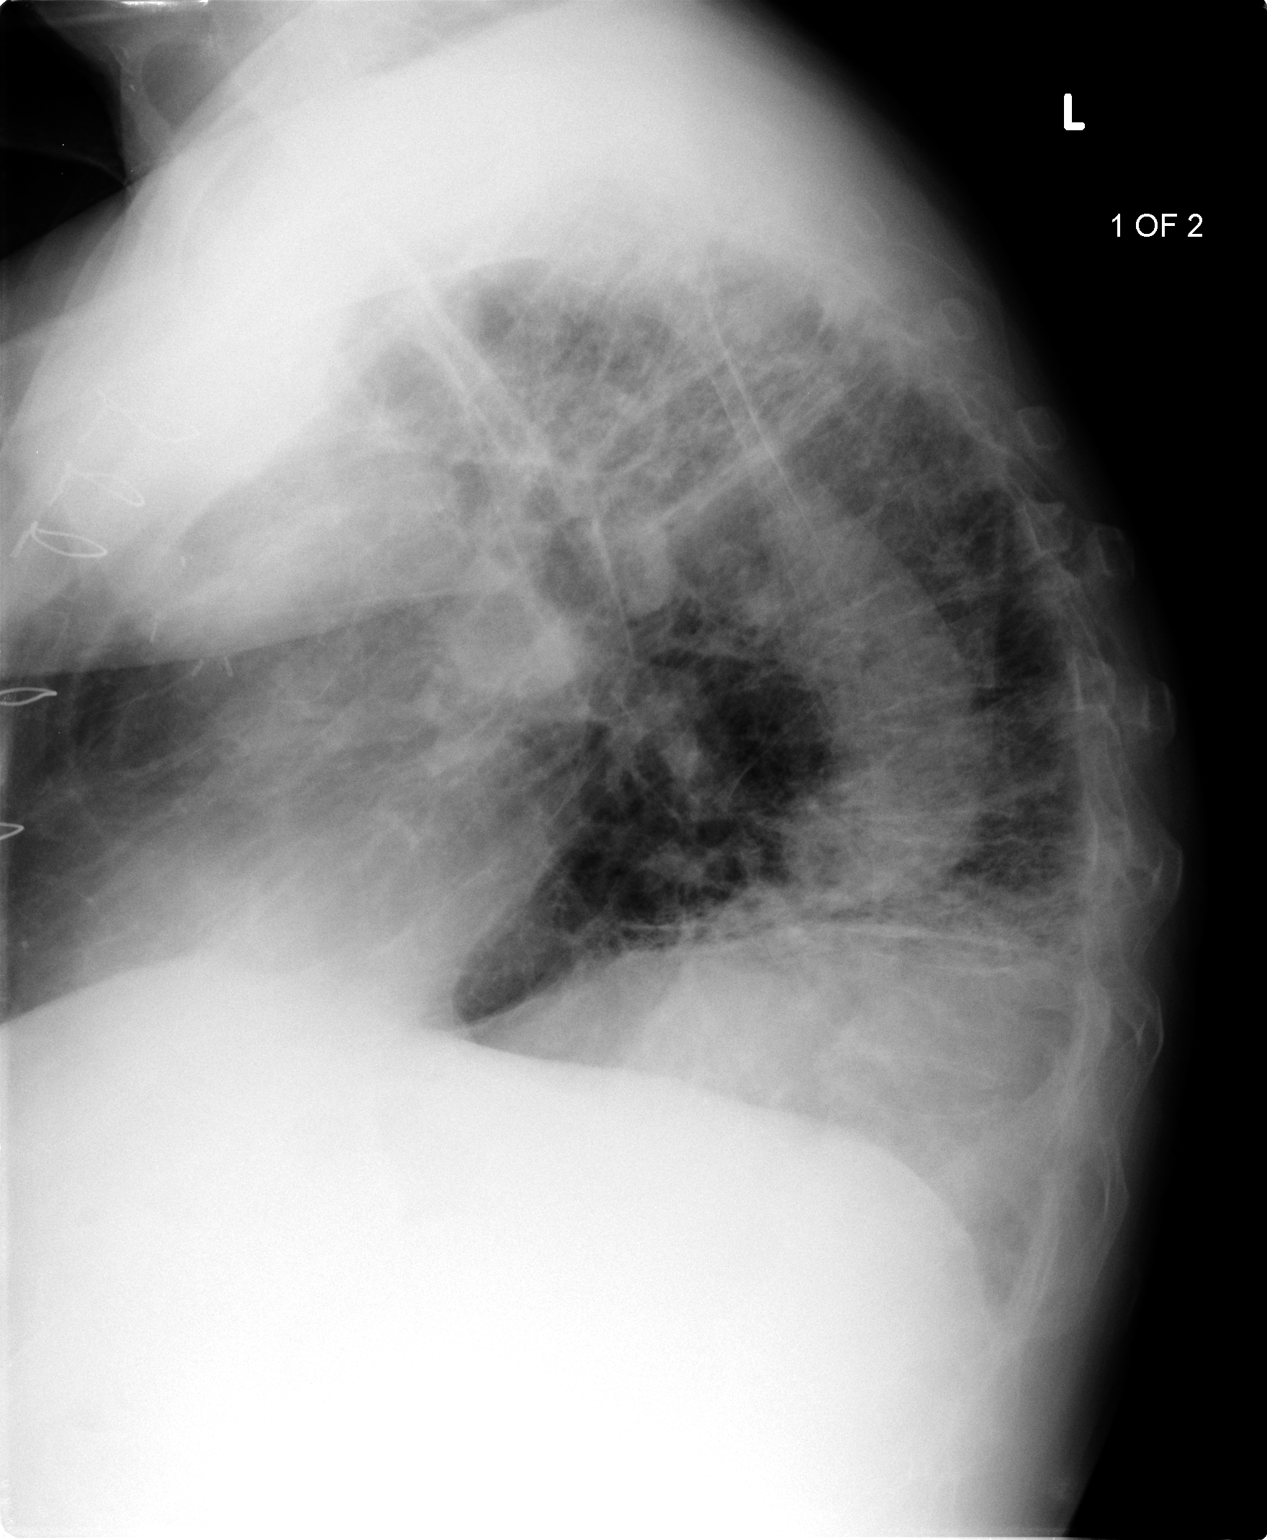

[view not recorded (3 of 3)]
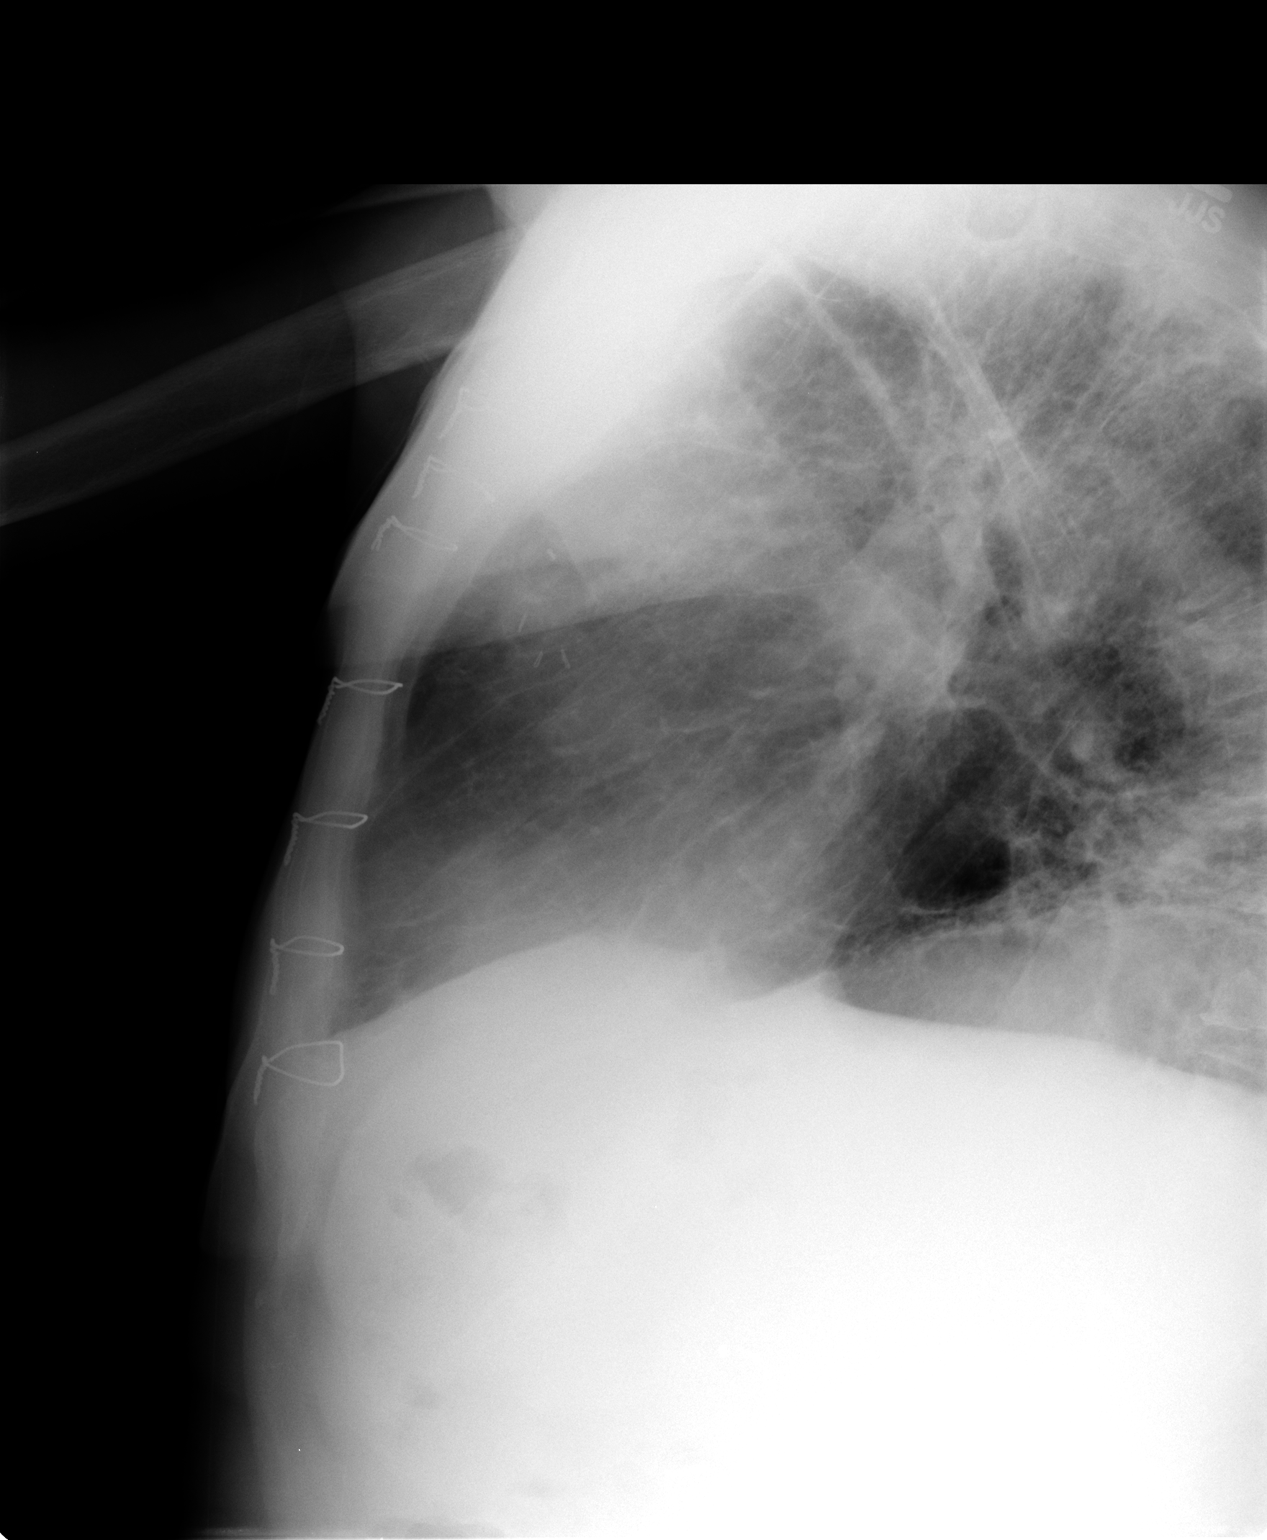

[3 of 3 positions shown; findings below may reference images not displayed]

FINDINGS: Severe chronic changes noted in the lungs with COPD and
areas of fibrosis/scarring in the upper lobes.  Airspace disease in
the left lung base could be scarring or acute infiltrate.  This is
similar to prior study.  No effusions. Heart is normal size.  Prior
median sternotomy.  No acute bony abnormality.
IMPRESSION: Stable appearance of the lungs.

## 2011-02-06 ENCOUNTER — Encounter: Payer: Self-pay | Admitting: *Deleted

## 2011-02-07 ENCOUNTER — Ambulatory Visit (INDEPENDENT_AMBULATORY_CARE_PROVIDER_SITE_OTHER): Payer: Medicare Other | Admitting: Internal Medicine

## 2011-02-07 ENCOUNTER — Encounter: Payer: Self-pay | Admitting: Internal Medicine

## 2011-02-07 VITALS — BP 112/78 | HR 62 | Ht 72.0 in | Wt 210.0 lb

## 2011-02-07 DIAGNOSIS — Z85038 Personal history of other malignant neoplasm of large intestine: Secondary | ICD-10-CM

## 2011-02-07 DIAGNOSIS — R197 Diarrhea, unspecified: Secondary | ICD-10-CM

## 2011-02-07 MED ORDER — PEG-KCL-NACL-NASULF-NA ASC-C 100 G PO SOLR: 1.0000 | Freq: Once | ORAL | Status: DC

## 2011-02-07 NOTE — Patient Instructions (Signed)
Colon [facility] 03/05/11 2:00 pm arrive at 1:00 pm on 4th floor Moviprep prescription sent to pharmacy Colon brochure given to you to review.

## 2011-02-07 NOTE — Progress Notes (Signed)
HISTORY OF PRESENT ILLNESS:  Gerald Dyer is a 77 y.o. male with multiple significant medical problems including prior stroke, hypertension, COPD, descending aortic aneurysm status post surgical repair, and metachronous colon cancer status post sigmoid resection 2006 and right hemicolectomy July 2011. His most recent colon cancer with stage II with ongoing observation therapy. He presents today for routine followup and consideration of surveillance colonoscopy. He is accompanied by his sister. Since his surgery, he has been exceptionally well. He has had improving strength and weight gain. Since May of 2011, he has gained about 35 pounds. He has dementia, he is active. His only GI complaint is intermittent loose stools. His other chronic medical problems have been stable.  REVIEW OF SYSTEMS:  All non-GI ROS negative except for memory difficulties and occasional wheezing  Past Medical History  Diagnosis Date  . ADENOCARCINOMA, COLON, HX OF 09/21/2008  . ANEMIA-IRON DEFICIENCY 05/23/2009  . ANGIODYSPLASIA-INTESTINE 05/23/2009  . CHEST PAIN, PLEURITIC 11/21/2008  . COPD 02/01/2010  . DEPRESSION 09/21/2008  . FECAL OCCULT BLOOD 11/08/2009  . GERD 09/21/2008  . HYPERTENSION 11/01/2008  . INSOMNIA 04/23/2010  . Memory loss 01/08/2009  . 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 SUBDURAL HEMATOMA 09/21/2008  . Pulmonary collapse 06/11/2009  . UNSPECIFIED BACTERIAL PNEUMONIA 02/14/2010  . Aorta aneurysm     Past Surgical History  Procedure Date  . Cholecystectomy   . Aortic valve replacement   . Partial colectomy     cancered removed    Social History Gerald Dyer  reports that he has quit smoking. He does not have any smokeless tobacco history on file. He reports that he does not drink alcohol or use illicit drugs.  family history includes Cancer in his father; Heart disease in his father; and Prostate cancer in his brother.  Allergies  Allergen Reactions  . Codeine Sulfate         PHYSICAL EXAMINATION: Vital signs: BP 112/78  Pulse 62  Ht 6' (1.829 m)  Wt 210 lb (95.255 kg)  BMI 28.48 kg/m2  Constitutional: generally well-appearing, no acute distress Psychiatric: alert and oriented x3, cooperative Eyes: extraocular movements intact, anicteric, conjunctiva pink Mouth: oral pharynx moist, no lesions Neck: supple no lymphadenopathy Cardiovascular: heart regular rate and rhythm, no murmur Lungs: clear to auscultation bilaterally Abdomen: soft, nontender, nondistended, no obvious ascites, no peritoneal signs, normal bowel sounds, no organomegaly. Surgical incisions well-healed Rectal: Deferred until colonoscopy Extremities: no lower extremity edema bilaterally Skin: no lesions on visible extremities Neuro: No focal deficits.   ASSESSMENT:  #1. History of metachronous colon cancer. Most recently, status post right hemicolectomy for stage II lesion. #2. Multiple medical problems. Stable. Overall, much stronger than the previous year #3. Diarrhea post hemicolectomy. May be due to rapid transit or bacterial overgrowth   PLAN:  #1. Surveillance colonoscopy. The patient is high-risk due to his age and comorbidities. The nature of the procedure, as well as the risks, benefits, and alternatives were carefully and thoroughly reviewed with the patient. Ample time for discussion and questions allowed. The patient understood, was satisfied, and agreed to proceed. Movi prep prescribed. The patient instructed on its use #2. Increase bulk fiber to see if this helps bowel habits. If not, try low-dose Imodium on demand

## 2011-02-12 ENCOUNTER — Telehealth: Payer: Self-pay | Admitting: Internal Medicine

## 2011-02-12 NOTE — Telephone Encounter (Signed)
Pts son, POA, called and has questions about his upcoming colon on 03/05/11. Gerald Dyer, the son, wanted to know why the colon is needed. He states his father is not having problems and that after his second colon surgery with Dr. Lindie Spruce a year ago, Dr. Lindie Spruce stated he did not need anything more. He states he doesn't want to put his father through if it isn't necessary. He states his father has dementia and really would not know to ask the questions that he is asking. Dr. Marina Goodell please advise.

## 2011-02-13 NOTE — Telephone Encounter (Signed)
I understand and support his wishes. Please take appointment out of computer schedule

## 2011-02-13 NOTE — Telephone Encounter (Signed)
Done

## 2011-02-13 NOTE — Telephone Encounter (Signed)
Spoke with Pts son and he would like to cancel the appt. States he does not want to put his dad through another procedure.

## 2011-02-13 NOTE — Telephone Encounter (Signed)
I discussed this with patient's sister (in detail), who came to appt w/ pt. I felt once look colonoscopy post surgery was reasonable just to make sure there are no residual lesions (particularly since his dad is much stronger than a year ago). If this is negative, no further exams needed.  If the son is not wanting to proceed, I understand given the patient's age and other issues. Let me know

## 2011-02-14 ENCOUNTER — Encounter: Payer: Self-pay | Admitting: Family Medicine

## 2011-03-05 ENCOUNTER — Other Ambulatory Visit: Payer: Medicare Other | Admitting: Internal Medicine

## 2011-03-10 ENCOUNTER — Other Ambulatory Visit: Payer: Self-pay | Admitting: Family Medicine

## 2011-03-21 LAB — URINALYSIS, ROUTINE W REFLEX MICROSCOPIC
Bilirubin Urine: NEGATIVE
Glucose, UA: NEGATIVE
Ketones, ur: NEGATIVE
pH: 6.5

## 2011-03-21 LAB — CBC
Hemoglobin: 13.4
MCHC: 33.6
MCV: 96.7
RBC: 4.13 — ABNORMAL LOW

## 2011-03-21 LAB — DIFFERENTIAL
Basophils Relative: 0
Eosinophils Absolute: 0.6
Monocytes Absolute: 0.4
Monocytes Relative: 6
Neutrophils Relative %: 63

## 2011-03-21 LAB — POCT I-STAT, CHEM 8
BUN: 24 — ABNORMAL HIGH
Calcium, Ion: 1.21
Chloride: 110
Creatinine, Ser: 1.6 — ABNORMAL HIGH
Glucose, Bld: 84

## 2011-03-21 LAB — URINE CULTURE
Colony Count: NO GROWTH
Culture: NO GROWTH

## 2011-03-24 LAB — BASIC METABOLIC PANEL
Calcium: 9.2
GFR calc Af Amer: >60
GFR calc non Af Amer: >60
Glucose, Bld: 82
Sodium: 141

## 2011-03-24 LAB — CBC
Hemoglobin: 11.3 — ABNORMAL LOW
RDW: 14

## 2011-03-26 LAB — BASIC METABOLIC PANEL
BUN: 18
CO2: 25
Calcium: 9.4
Chloride: 104
GFR calc Af Amer: 34 — ABNORMAL LOW
GFR calc Af Amer: 42 — ABNORMAL LOW
GFR calc non Af Amer: 28 — ABNORMAL LOW
GFR calc non Af Amer: 35 — ABNORMAL LOW
GFR calc non Af Amer: >60
Glucose, Bld: 102 — ABNORMAL HIGH
Glucose, Bld: 119 — ABNORMAL HIGH
Glucose, Bld: 91
Potassium: 4.5
Potassium: 5
Potassium: 5.4 — ABNORMAL HIGH
Sodium: 136
Sodium: 138
Sodium: 138

## 2011-03-26 LAB — DIFFERENTIAL
Lymphs Abs: 1.9
Monocytes Relative: 5
Neutro Abs: 6.9
Neutrophils Relative %: 73

## 2011-03-26 LAB — COMPREHENSIVE METABOLIC PANEL
BUN: 36 — ABNORMAL HIGH
CO2: 25
Calcium: 9.3
Creatinine, Ser: 1.89 — ABNORMAL HIGH
GFR calc non Af Amer: 34 — ABNORMAL LOW
Glucose, Bld: 106 — ABNORMAL HIGH
Total Protein: 6.4

## 2011-03-26 LAB — CBC
HCT: 30.4 — ABNORMAL LOW
HCT: 32.1 — ABNORMAL LOW
Hemoglobin: 10.7 — ABNORMAL LOW
Hemoglobin: 10.7 — ABNORMAL LOW
MCHC: 35.1
MCV: 91.5
RBC: 3.4 — ABNORMAL LOW
RDW: 13.6
RDW: 13.6
WBC: 13.8 — ABNORMAL HIGH

## 2011-03-31 ENCOUNTER — Encounter (HOSPITAL_BASED_OUTPATIENT_CLINIC_OR_DEPARTMENT_OTHER): Payer: Medicare Other | Admitting: Hematology & Oncology

## 2011-03-31 ENCOUNTER — Other Ambulatory Visit: Payer: Self-pay | Admitting: Family

## 2011-03-31 DIAGNOSIS — F0390 Unspecified dementia without behavioral disturbance: Secondary | ICD-10-CM

## 2011-03-31 DIAGNOSIS — C187 Malignant neoplasm of sigmoid colon: Secondary | ICD-10-CM

## 2011-03-31 LAB — COMPREHENSIVE METABOLIC PANEL
ALT: 16 U/L (ref 0–53)
BUN: 16 mg/dL (ref 6–23)
CO2: 21 mEq/L (ref 19–32)
Calcium: 8.8 mg/dL (ref 8.4–10.5)
Chloride: 109 mEq/L (ref 96–112)
Creatinine, Ser: 1.17 mg/dL (ref 0.50–1.35)
Glucose, Bld: 83 mg/dL (ref 70–99)
Total Bilirubin: 0.4 mg/dL (ref 0.3–1.2)

## 2011-03-31 LAB — CBC WITH DIFFERENTIAL (CANCER CENTER ONLY)
BASO#: 0 10*3/uL (ref 0.0–0.2)
Eosinophils Absolute: 0.2 10*3/uL (ref 0.0–0.5)
HCT: 45 % (ref 38.7–49.9)
HGB: 15.4 g/dL (ref 13.0–17.1)
LYMPH#: 1.9 10*3/uL (ref 0.9–3.3)
LYMPH%: 22.5 % (ref 14.0–48.0)
MCV: 88 fL (ref 82–98)
MONO#: 0.6 10*3/uL (ref 0.1–0.9)
NEUT%: 68.6 % (ref 40.0–80.0)
RDW: 14.6 % (ref 11.1–15.7)
WBC: 8.6 10*3/uL (ref 4.0–10.0)

## 2011-03-31 LAB — CEA: CEA: 0.7 ng/mL (ref 0.0–5.0)

## 2011-04-01 ENCOUNTER — Other Ambulatory Visit: Payer: Self-pay | Admitting: Family Medicine

## 2011-04-07 ENCOUNTER — Encounter: Payer: Self-pay | Admitting: Family Medicine

## 2011-04-07 ENCOUNTER — Ambulatory Visit (INDEPENDENT_AMBULATORY_CARE_PROVIDER_SITE_OTHER): Payer: Medicare Other | Admitting: Family Medicine

## 2011-04-07 VITALS — BP 128/78 | Temp 98.5°F | Wt 212.0 lb

## 2011-04-07 DIAGNOSIS — R413 Other amnesia: Secondary | ICD-10-CM

## 2011-04-07 DIAGNOSIS — F329 Major depressive disorder, single episode, unspecified: Secondary | ICD-10-CM

## 2011-04-07 DIAGNOSIS — J449 Chronic obstructive pulmonary disease, unspecified: Secondary | ICD-10-CM

## 2011-04-07 DIAGNOSIS — Z23 Encounter for immunization: Secondary | ICD-10-CM

## 2011-04-07 MED ORDER — TIOTROPIUM BROMIDE MONOHYDRATE 18 MCG IN CAPS: 1.0000 | ORAL_CAPSULE | Freq: Every day | RESPIRATORY_TRACT | Status: DC

## 2011-04-07 NOTE — Progress Notes (Signed)
  Subjective:    Patient ID: Gerald Dyer, male    DOB: [birth year], 77 y.o.   MRN: [id number]  HPI Medical followup. Patient has history of dementia, depression, borderline hypertension, COPD, GERD, angiodysplasias of the intestine, and history of colon cancer. Continues to have frequent loose stools. Recent CEA and electrolytes unremarkable. No anemia by recent labs.  Respiratory status stable. Requesting refill Spiriva. Needs flu vaccine. No dyspnea at rest. No recent cough. Some mild dyspnea with exertion. No chest pains.  Depression treated with sertraline 100 mg. Good appetite. No crying spells. Mood seems stable.  No agitation or wandering behavior.  Past Medical History  Diagnosis Date  . ADENOCARCINOMA, COLON, HX OF 09/21/2008  . ANEMIA-IRON DEFICIENCY 05/23/2009  . ANGIODYSPLASIA-INTESTINE 05/23/2009  . CHEST PAIN, PLEURITIC 11/21/2008  . COPD 02/01/2010  . DEPRESSION 09/21/2008  . FECAL OCCULT BLOOD 11/08/2009  . GERD 09/21/2008  . HYPERTENSION 11/01/2008  . INSOMNIA 04/23/2010  . Memory loss 01/08/2009  . 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 SUBDURAL HEMATOMA 09/21/2008  . Pulmonary collapse 06/11/2009  . UNSPECIFIED BACTERIAL PNEUMONIA 02/14/2010  . Aorta aneurysm    Past Surgical History  Procedure Date  . Cholecystectomy   . Aortic valve replacement   . Partial colectomy     cancered removed    reports that he has quit smoking. He does not have any smokeless tobacco history on file. He reports that he does not drink alcohol or use illicit drugs. family history includes Cancer in his father; Heart disease in his father; and Prostate cancer in his brother. Allergies  Allergen Reactions  . Codeine Sulfate       Review of Systems  Constitutional: Negative for fever, chills and unexpected weight change.  Respiratory: Negative for cough and wheezing.   Cardiovascular: Negative for chest pain, palpitations and leg swelling.  Gastrointestinal: Negative for nausea,  vomiting, abdominal pain and blood in stool.  Genitourinary: Negative for dysuria.  Neurological: Negative for dizziness, syncope and weakness.  Psychiatric/Behavioral: Negative for dysphoric mood.       Objective:   Physical Exam  Constitutional: He is oriented to person, place, and time. He appears well-developed and well-nourished.  HENT:  Mouth/Throat: Oropharynx is clear and moist.  Neck: Neck supple.  Cardiovascular: Normal rate and regular rhythm.   Pulmonary/Chest: Effort normal. He has no wheezes. He has no rales.       Somewhat diminished breath sounds in both bases  Musculoskeletal: He exhibits no edema.  Lymphadenopathy:    He has no cervical adenopathy.  Neurological: He is alert and oriented to person, place, and time.  Psychiatric: He has a normal mood and affect. His behavior is normal.          Assessment & Plan:  #1 COPD. Stable. Flu vaccine given. Refills Spiriva for one year #2 history of depression stable. Continue sertraline 100 mg daily #3 dementia. Overall stable. Continue Aricept 10 mg daily.  #4 history of colon cancer. Recent CEA normal.

## 2011-06-06 ENCOUNTER — Other Ambulatory Visit: Payer: Self-pay | Admitting: Family Medicine

## 2011-09-11 ENCOUNTER — Ambulatory Visit: Payer: Medicare Other | Admitting: Pulmonary Disease

## 2011-10-01 ENCOUNTER — Other Ambulatory Visit: Payer: Medicare Other | Admitting: Lab

## 2011-10-01 ENCOUNTER — Ambulatory Visit: Payer: Medicare Other | Admitting: Hematology & Oncology

## 2011-10-06 ENCOUNTER — Encounter: Payer: Self-pay | Admitting: Pulmonary Disease

## 2011-10-06 ENCOUNTER — Encounter: Payer: Self-pay | Admitting: Family Medicine

## 2011-10-06 ENCOUNTER — Ambulatory Visit (INDEPENDENT_AMBULATORY_CARE_PROVIDER_SITE_OTHER): Payer: Medicare Other | Admitting: Pulmonary Disease

## 2011-10-06 ENCOUNTER — Ambulatory Visit (INDEPENDENT_AMBULATORY_CARE_PROVIDER_SITE_OTHER): Payer: Medicare Other | Admitting: Family Medicine

## 2011-10-06 VITALS — BP 124/70 | Temp 97.5°F | Wt 220.0 lb

## 2011-10-06 VITALS — BP 128/66 | HR 89 | Temp 98.0°F | Ht 72.0 in | Wt 221.2 lb

## 2011-10-06 DIAGNOSIS — J449 Chronic obstructive pulmonary disease, unspecified: Secondary | ICD-10-CM

## 2011-10-06 DIAGNOSIS — I10 Essential (primary) hypertension: Secondary | ICD-10-CM

## 2011-10-06 DIAGNOSIS — R413 Other amnesia: Secondary | ICD-10-CM

## 2011-10-06 DIAGNOSIS — F329 Major depressive disorder, single episode, unspecified: Secondary | ICD-10-CM

## 2011-10-06 NOTE — Progress Notes (Signed)
  Subjective:    Patient ID: Gerald Dyer, male    DOB: 1924/12/03, 78 y.o.   MRN: [id number]  HPI  Medical followup. Patient has history of COPD, angiodysplasias of intestine, depression, GERD, dementia. He is accompanied by his son today. He has excellent home care. Medications reviewed. Compliant with all. Depression stable. Good appetite. No recent weight changes. He has some unsteadiness with gait he uses a cane at all times. No recent falls. Starting to sleep more during the day. No one wandering behaviors.  Son has noticed that he has some dyspnea with activity but is never complained of any chest pain. Previous history of aortic valve replacement.  Past Medical History  Diagnosis Date  . ADENOCARCINOMA, COLON, HX OF 09/21/2008  . ANEMIA-IRON DEFICIENCY 05/23/2009  . ANGIODYSPLASIA-INTESTINE 05/23/2009  . CHEST PAIN, PLEURITIC 11/21/2008  . COPD 02/01/2010  . DEPRESSION 09/21/2008  . FECAL OCCULT BLOOD 11/08/2009  . GERD 09/21/2008  . HYPERTENSION 11/01/2008  . INSOMNIA 04/23/2010  . Memory loss 01/08/2009  . 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 SUBDURAL HEMATOMA 09/21/2008  . Pulmonary collapse 06/11/2009  . UNSPECIFIED BACTERIAL PNEUMONIA 02/14/2010  . Aorta aneurysm    Past Surgical History  Procedure Date  . Cholecystectomy   . Aortic valve replacement   . Partial colectomy     cancered removed    reports that he has quit smoking. He does not have any smokeless tobacco history on file. He reports that he does not drink alcohol or use illicit drugs. family history includes Cancer in his father; Heart disease in his father; and Prostate cancer in his brother. Allergies  Allergen Reactions  . Codeine Sulfate       Review of Systems  Constitutional: Negative for appetite change and unexpected weight change.  Respiratory: Positive for shortness of breath. Negative for cough and wheezing.   Cardiovascular: Negative for chest pain, palpitations and leg swelling.    Gastrointestinal: Negative for abdominal pain.  Neurological: Negative for headaches.  Psychiatric/Behavioral: Negative for agitation.       Objective:   Physical Exam  Constitutional: He appears well-developed and well-nourished.  Neck: Neck supple. No thyromegaly present.  Cardiovascular: Normal rate and regular rhythm.   Pulmonary/Chest: Effort normal and breath sounds normal. No respiratory distress. He has no wheezes.  Musculoskeletal: He exhibits no edema.  Neurological: He is alert.          Assessment & Plan:  #1 dementia. Continue Aricept 10 mg daily. We discussed safety issues in the home. He does not drive.  #2 history of depression stable on sertraline. Continue current medication #3 history of COPD. Has followup with pulmonary later today.  #4 history of dyspnea possibly related to #3 Family not interested in cardiac workup at this time #5 advanced directives. Long discussion with son. They request DO NOT RESUSCITATE order for home and this was provided

## 2011-10-06 NOTE — Patient Instructions (Signed)
Stay on spiriva. Stay as active as possible.  If you feel your breathing is not as good as in past, we can try additional medication.  Just let us know. followup with me in one year if you are stable.

## 2011-10-06 NOTE — Assessment & Plan Note (Signed)
The patient appears to be stable from a pulmonary standpoint.  He is trying to get around, but will get dyspneic if he rushes.  He is on Spiriva alone, and I discussed with the patient and his family intensifying in his medical regimen.  However, I suspect there are a lot of other factors contributing to his shortness of breath, including his weight and debility, age, and poor balance.  He will let me know if he sees any significant change in his breathing, and we can can consider more aggressive bronchodilator medications.  His sats today walking from the waiting room or 89%, but he was in no distress.

## 2011-10-06 NOTE — Progress Notes (Signed)
  Subjective:    Patient ID: Gerald Dyer, male    DOB: Apr 13, 1925, 78 y.o.   MRN: [id number]  HPI The patient comes in today for followup of his COPD.  He has been taking Spiriva compliantly, and denies any recent chest infections or acute exacerbation since the last visit.  He has dyspnea on exertion if he tries to rush or do too much, but feels he is stable if he takes his time.  He denies any significant cough or mucus production.   Review of Systems  Constitutional: Negative.  Negative for fever and unexpected weight change.  HENT: Negative.  Negative for ear pain, nosebleeds, congestion, sore throat, rhinorrhea, sneezing, trouble swallowing, dental problem, postnasal drip and sinus pressure.   Eyes: Negative.  Negative for redness and itching.  Respiratory: Negative.  Negative for cough, chest tightness, shortness of breath and wheezing.   Cardiovascular: Negative.  Negative for palpitations and leg swelling.  Gastrointestinal: Negative.  Negative for nausea and vomiting.  Genitourinary: Negative.  Negative for dysuria.  Musculoskeletal: Negative.  Negative for joint swelling.  Skin: Negative.  Negative for rash.  Neurological: Negative.  Negative for headaches.  Hematological: Negative.  Does not bruise/bleed easily.  Psychiatric/Behavioral: Negative.  Negative for dysphoric mood. The patient is not nervous/anxious.        Objective:   Physical Exam Overweight male in no acute distress Nose without purulence or discharge noted Chest with faint basilar crackles, decreased air flow, no wheezes or rhonchi Cardiac exam sounds are regular Lower extremities with no significant edema, no cyanosis Alert and oriented, moves all 4 extremities.       Assessment & Plan:

## 2011-10-07 ENCOUNTER — Other Ambulatory Visit: Payer: Self-pay | Admitting: Family Medicine

## 2011-10-29 ENCOUNTER — Other Ambulatory Visit: Payer: Self-pay | Admitting: Pulmonary Disease

## 2011-11-11 ENCOUNTER — Other Ambulatory Visit: Payer: Self-pay | Admitting: Family Medicine

## 2012-04-09 ENCOUNTER — Telehealth: Payer: Self-pay | Admitting: Family Medicine

## 2012-04-09 NOTE — Telephone Encounter (Signed)
Caller: Rodney/Child; Patient Name: Gerald Dyer; PCP: Evelena Peat [facility]); Best Callback Phone Number: [phone]. Son is calling in regards to his father.  He states his father turned 7 last month.  His father is having difficulty getting around and is becoming more short of breath with exertions.  He had a six month appointment for next Tuesday  04/13/12 and the family cancelled because he is having difficulty getting around.  He has a walker and caregivers staying with his dad. Dementia is getting worse but everything remains about the same per son.    Son would like to know if Dr. Caryl Never refill his medication even though he cannot come  to the appointment due to his inability to get around.  Advised son that I would forward his request to Dr. Caryl Never. Understanding expressed. FAMILY DOES NOT WANT TO BRING PATIENT IN FOR APPOINTMENTS, REQUESTING MEDICATION REFILLS.

## 2012-04-09 NOTE — Telephone Encounter (Signed)
I spoke at length with son [name], pt son who has been bringing him in every 6 months.  His father gets out of breath when he walks any distance (which is not new), but even 6 months ago he struggled coming to see Dr Caryl Never. They have the same 2 aids come to the home (self pay) that they have had for the past 2 years for when a family member cannot be with him.  Nothing has really changed, he eats well, however is now sleeping most of the time.  He has a walker, however has had a few falls.  They want to keep him in his home as long as possible.  Appt for Tues 10/22 has been cancelled, and I reassured son we will be happy to fill fathers meds.  [name] voiced his appreciation. FYI

## 2012-04-13 ENCOUNTER — Ambulatory Visit: Payer: Medicare Other | Admitting: Family Medicine

## 2012-04-28 ENCOUNTER — Emergency Department (HOSPITAL_COMMUNITY): Payer: Medicare Other

## 2012-04-28 ENCOUNTER — Inpatient Hospital Stay (HOSPITAL_COMMUNITY)
Admission: EM | Admit: 2012-04-28 | Discharge: 2012-05-01 | DRG: 193 | Disposition: A | Discharge status: Home Health | Payer: Medicare Other | Attending: Internal Medicine | Admitting: Internal Medicine

## 2012-04-28 DIAGNOSIS — F329 Major depressive disorder, single episode, unspecified: Secondary | ICD-10-CM

## 2012-04-28 DIAGNOSIS — J4489 Other specified chronic obstructive pulmonary disease: Secondary | ICD-10-CM

## 2012-04-28 DIAGNOSIS — F068 Other specified mental disorders due to known physiological condition: Secondary | ICD-10-CM | POA: Diagnosis present

## 2012-04-28 DIAGNOSIS — J9600 Acute respiratory failure, unspecified whether with hypoxia or hypercapnia: Secondary | ICD-10-CM

## 2012-04-28 DIAGNOSIS — D72829 Elevated white blood cell count, unspecified: Secondary | ICD-10-CM

## 2012-04-28 DIAGNOSIS — E860 Dehydration: Secondary | ICD-10-CM

## 2012-04-28 DIAGNOSIS — K219 Gastro-esophageal reflux disease without esophagitis: Secondary | ICD-10-CM

## 2012-04-28 DIAGNOSIS — J441 Chronic obstructive pulmonary disease with (acute) exacerbation: Secondary | ICD-10-CM

## 2012-04-28 DIAGNOSIS — G4700 Insomnia, unspecified: Secondary | ICD-10-CM

## 2012-04-28 DIAGNOSIS — J449 Chronic obstructive pulmonary disease, unspecified: Secondary | ICD-10-CM

## 2012-04-28 DIAGNOSIS — F0390 Unspecified dementia without behavioral disturbance: Secondary | ICD-10-CM

## 2012-04-28 DIAGNOSIS — Z85038 Personal history of other malignant neoplasm of large intestine: Secondary | ICD-10-CM

## 2012-04-28 DIAGNOSIS — R195 Other fecal abnormalities: Secondary | ICD-10-CM

## 2012-04-28 DIAGNOSIS — J159 Unspecified bacterial pneumonia: Secondary | ICD-10-CM

## 2012-04-28 DIAGNOSIS — D509 Iron deficiency anemia, unspecified: Secondary | ICD-10-CM

## 2012-04-28 DIAGNOSIS — I10 Essential (primary) hypertension: Secondary | ICD-10-CM

## 2012-04-28 DIAGNOSIS — Z79899 Other long term (current) drug therapy: Secondary | ICD-10-CM

## 2012-04-28 DIAGNOSIS — R071 Chest pain on breathing: Secondary | ICD-10-CM

## 2012-04-28 DIAGNOSIS — K432 Incisional hernia without obstruction or gangrene: Secondary | ICD-10-CM

## 2012-04-28 DIAGNOSIS — F3289 Other specified depressive episodes: Secondary | ICD-10-CM

## 2012-04-28 DIAGNOSIS — J189 Pneumonia, unspecified organism: Principal | ICD-10-CM

## 2012-04-28 DIAGNOSIS — K5520 Angiodysplasia of colon without hemorrhage: Secondary | ICD-10-CM

## 2012-04-28 DIAGNOSIS — D490 Neoplasm of unspecified behavior of digestive system: Secondary | ICD-10-CM

## 2012-04-28 DIAGNOSIS — J9819 Other pulmonary collapse: Secondary | ICD-10-CM

## 2012-04-28 DIAGNOSIS — Z954 Presence of other heart-valve replacement: Secondary | ICD-10-CM

## 2012-04-28 DIAGNOSIS — Z66 Do not resuscitate: Secondary | ICD-10-CM | POA: Diagnosis present

## 2012-04-28 DIAGNOSIS — I6200 Nontraumatic subdural hemorrhage, unspecified: Secondary | ICD-10-CM

## 2012-04-28 DIAGNOSIS — I129 Hypertensive chronic kidney disease with stage 1 through stage 4 chronic kidney disease, or unspecified chronic kidney disease: Secondary | ICD-10-CM | POA: Diagnosis present

## 2012-04-28 DIAGNOSIS — J9601 Acute respiratory failure with hypoxia: Secondary | ICD-10-CM

## 2012-04-28 DIAGNOSIS — N183 Chronic kidney disease, stage 3 unspecified: Secondary | ICD-10-CM

## 2012-04-28 DIAGNOSIS — R413 Other amnesia: Secondary | ICD-10-CM

## 2012-04-28 LAB — CBC WITH DIFFERENTIAL/PLATELET
Basophils Absolute: 0 10*3/uL (ref 0.0–0.1)
Eosinophils Relative: 1 % (ref 0–5)
HCT: 46.4 % (ref 39.0–52.0)
Hemoglobin: 15.7 g/dL (ref 13.0–17.0)
Lymphocytes Relative: 10 % — ABNORMAL LOW (ref 12–46)
Lymphs Abs: 1.3 10*3/uL (ref 0.7–4.0)
MCV: 89.1 fL (ref 78.0–100.0)
Monocytes Absolute: 0.8 10*3/uL (ref 0.1–1.0)
Monocytes Relative: 7 % (ref 3–12)
Neutro Abs: 10 10*3/uL — ABNORMAL HIGH (ref 1.7–7.7)
RDW: 14.8 % (ref 11.5–15.5)
WBC: 12.1 10*3/uL — ABNORMAL HIGH (ref 4.0–10.5)

## 2012-04-28 LAB — COMPREHENSIVE METABOLIC PANEL
AST: 22 U/L (ref 0–37)
BUN: 17 mg/dL (ref 6–23)
CO2: 21 mEq/L (ref 19–32)
Calcium: 9.1 mg/dL (ref 8.4–10.5)
Chloride: 102 mEq/L (ref 96–112)
Creatinine, Ser: 1.16 mg/dL (ref 0.50–1.35)
GFR calc Af Amer: 64 mL/min — ABNORMAL LOW (ref >90)
GFR calc non Af Amer: 56 mL/min — ABNORMAL LOW (ref >90)
Glucose, Bld: 132 mg/dL — ABNORMAL HIGH (ref 70–99)
Total Bilirubin: 0.4 mg/dL (ref 0.3–1.2)

## 2012-04-28 LAB — LACTIC ACID, PLASMA: Lactic Acid, Venous: 1.3 mmol/L (ref 0.5–2.2)

## 2012-04-28 MED ORDER — LEVALBUTEROL HCL 1.25 MG/0.5ML IN NEBU
2.5000 mg | INHALATION_SOLUTION | RESPIRATORY_TRACT | Status: DC
  Administered 2012-04-28: 2.5 mg via RESPIRATORY_TRACT
  Filled 2012-04-28 (×4): qty 1

## 2012-04-28 MED ORDER — SODIUM CHLORIDE 0.9 % IV SOLN
INTRAVENOUS | Status: AC
  Administered 2012-04-28: 22:00:00 via INTRAVENOUS

## 2012-04-28 MED ORDER — IPRATROPIUM BROMIDE 0.02 % IN SOLN
0.5000 mg | RESPIRATORY_TRACT | Status: DC
  Administered 2012-04-28: 0.5 mg via RESPIRATORY_TRACT
  Filled 2012-04-28: qty 2.5

## 2012-04-28 MED ORDER — ALBUTEROL SULFATE (5 MG/ML) 0.5% IN NEBU
2.5000 mg | INHALATION_SOLUTION | RESPIRATORY_TRACT | Status: DC
  Administered 2012-04-28: 2.5 mg via RESPIRATORY_TRACT
  Filled 2012-04-28: qty 0.5

## 2012-04-28 MED ORDER — DEXTROSE 5 % IV SOLN
1.0000 g | INTRAVENOUS | Status: DC
  Administered 2012-04-29: 1 g via INTRAVENOUS
  Filled 2012-04-28 (×3): qty 10

## 2012-04-28 MED ORDER — ALBUTEROL SULFATE (5 MG/ML) 0.5% IN NEBU
2.5000 mg | INHALATION_SOLUTION | RESPIRATORY_TRACT | Status: DC | PRN
  Administered 2012-04-30 (×2): 2.5 mg via RESPIRATORY_TRACT
  Filled 2012-04-28 (×2): qty 0.5

## 2012-04-28 MED ORDER — DEXTROSE 5 % IV SOLN
1.0000 g | Freq: Once | INTRAVENOUS | Status: AC
  Administered 2012-04-28: 1 g via INTRAVENOUS
  Filled 2012-04-28: qty 10

## 2012-04-28 MED ORDER — IPRATROPIUM BROMIDE 0.02 % IN SOLN: 0.5000 mg | RESPIRATORY_TRACT | Status: DC

## 2012-04-28 MED ORDER — METHYLPREDNISOLONE SODIUM SUCC 125 MG IJ SOLR
125.0000 mg | Freq: Once | INTRAMUSCULAR | Status: AC
  Administered 2012-04-28: 125 mg via INTRAVENOUS
  Filled 2012-04-28: qty 2

## 2012-04-28 MED ORDER — SODIUM CHLORIDE 0.9 % IV SOLN
INTRAVENOUS | Status: AC
  Administered 2012-04-29: 1000 mL via INTRAVENOUS
  Administered 2012-04-29: 75 mL/h via INTRAVENOUS

## 2012-04-28 MED ORDER — ALBUTEROL SULFATE (5 MG/ML) 0.5% IN NEBU: 2.5000 mg | INHALATION_SOLUTION | RESPIRATORY_TRACT | Status: DC

## 2012-04-28 MED ORDER — DEXTROSE 5 % IV SOLN
500.0000 mg | Freq: Once | INTRAVENOUS | Status: AC
  Administered 2012-04-28: 500 mg via INTRAVENOUS
  Filled 2012-04-28: qty 500

## 2012-04-28 MED ORDER — TIOTROPIUM BROMIDE MONOHYDRATE 18 MCG IN CAPS
1.0000 | ORAL_CAPSULE | Freq: Every day | RESPIRATORY_TRACT | Status: DC
  Administered 2012-04-29 – 2012-05-01 (×3): 18 ug via RESPIRATORY_TRACT
  Filled 2012-04-28: qty 5

## 2012-04-28 MED ORDER — INFLUENZA VIRUS VACC SPLIT PF IM SUSP
0.5000 mL | INTRAMUSCULAR | Status: AC
  Administered 2012-04-29: 0.5 mL via INTRAMUSCULAR
  Filled 2012-04-28: qty 0.5

## 2012-04-28 MED ORDER — ALBUTEROL SULFATE (5 MG/ML) 0.5% IN NEBU: 5.0000 mg | INHALATION_SOLUTION | RESPIRATORY_TRACT | Status: DC

## 2012-04-28 MED ORDER — DEXTROSE 5 % IV SOLN
500.0000 mg | INTRAVENOUS | Status: DC
  Administered 2012-04-29: 500 mg via INTRAVENOUS
  Filled 2012-04-28 (×4): qty 500

## 2012-04-28 NOTE — ED Notes (Signed)
Per report from [facility] pt began to have sob yesterday.  Pt states that he has nebulizers at home but has not been using them. He is in resp distress he is able to talk in partial sentences.  Audible wheezes noted.

## 2012-04-28 NOTE — ED Provider Notes (Signed)
I saw and evaluated the patient, reviewed the resident's note and I agree with the findings and plan.   .Face to face Exam:  General:  Awake HEENT:  Atraumatic Resp: Wheezing throughout all lung fields Abd:  Nondistended Neuro:No focal weakness Lymph: No adenopathy   Nelia Shi, MD 04/28/12 2135

## 2012-04-28 NOTE — ED Notes (Signed)
Pt is a DNR.

## 2012-04-28 NOTE — H&P (Signed)
PCP:   Kristian Covey, MD   Chief Complaint:  Sob wheezing  HPI: 78 yo male h/o copd, dementia lives with his son who has been having 2 days of worsenign sob and wheezing.  Son bought him to ED and has pna on xray.  No recent abx use.  Coughing for several days no fevers/n/v/d.  No home oxygen use.  At baseline walks around but chronically sob with activity due to his copd.  Pt has no complaints but is dypneic at rest.  Review of Systems:  O/w unobtainable from pt  Past Medical History: Past Medical History  Diagnosis Date  . ADENOCARCINOMA, COLON, HX OF 09/21/2008  . ANEMIA-IRON DEFICIENCY 05/23/2009  . ANGIODYSPLASIA-INTESTINE 05/23/2009  . CHEST PAIN, PLEURITIC 11/21/2008  . COPD 02/01/2010  . DEPRESSION 09/21/2008  . FECAL OCCULT BLOOD 11/08/2009  . GERD 09/21/2008  . HYPERTENSION 11/01/2008  . INSOMNIA 04/23/2010  . Memory loss 01/08/2009  . 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 SUBDURAL HEMATOMA 09/21/2008  . Pulmonary collapse 06/11/2009  . UNSPECIFIED BACTERIAL PNEUMONIA 02/14/2010  . Aorta aneurysm    Past Surgical History  Procedure Date  . Cholecystectomy   . Aortic valve replacement   . Partial colectomy     cancered removed    Medications: Prior to Admission medications   Medication Sig Start Date End Date Taking? Authorizing Provider  albuterol (PROVENTIL) (2.5 MG/3ML) 0.083% nebulizer solution Take 2.5 mg by nebulization every 6 (six) hours as needed. For shortness of breath   Yes Historical Provider, MD  aspirin 81 MG tablet Take 81 mg by mouth daily.     Yes Historical Provider, MD  donepezil (ARICEPT) 10 MG tablet TAKE 1 TABLET BY MOUTH DAILY 10/07/11  Yes Kristian Covey, MD  ferrous sulfate 325 (65 FE) MG tablet Take 325 mg by mouth daily with breakfast.     Yes Historical Provider, MD  Multiple Vitamin (MULTIVITAMIN) tablet Take 1 tablet by mouth daily.     Yes Historical Provider, MD  omeprazole (PRILOSEC) 20 MG capsule Take 20 mg by mouth daily as needed.  For GERD   Yes Historical Provider, MD  sertraline (ZOLOFT) 100 MG tablet TAKE 1 TABLET BY MOUTH EVERY DAY 11/11/11  Yes Kristian Covey, MD  tiotropium (SPIRIVA HANDIHALER) 18 MCG inhalation capsule Place 1 capsule (18 mcg total) into inhaler and inhale daily. 04/07/11  Yes Kristian Covey, MD    Allergies:   Allergies  Allergen Reactions  . Codeine Sulfate     Unknown    Social History:  reports that he quit smoking about 23 years ago. His smoking use included Cigarettes. He smoked 2.5 packs per day. He does not have any smokeless tobacco history on file. He reports that he does not drink alcohol or use illicit drugs.  Family History: Family History  Problem Relation Age of Onset  . Heart disease Father   . Prostate cancer Brother   . Cancer Father     kind unknown    Physical Exam: Filed Vitals:   04/28/12 1854 04/28/12 1856 04/28/12 1858 04/28/12 1859  BP:    138/67  Pulse: 107   107  Temp: 99.2 F (37.3 C)   99.2 F (37.3 C)  TempSrc: Oral   Oral  Resp: 24   24  Height: 6' (1.829 m)     Weight: 99.791 kg (220 lb)     SpO2: 97% 99% 98% 98%   General appearance: alert, cooperative and mild distress Neck: no  JVD and supple, symmetrical, trachea midline Lungs: wheezes bilaterally Heart: regular rate and rhythm, S1, S2 normal, no murmur, click, rub or gallop Abdomen: soft, non-tender; bowel sounds normal; no masses,  no organomegaly ?palp mass around incision ruq nonpainful Extremities: extremities normal, atraumatic, no cyanosis or edema Pulses: 2+ and symmetric Skin: Skin color, texture, turgor normal. No rashes or lesions Neurologic: Grossly normal   Labs on Admission:   [facility] 04/28/12 2034  NA 136  K 3.8  CL 102  CO2 21  GLUCOSE 132*  BUN 17  CREATININE 1.16  CALCIUM 9.1  MG --  PHOS --    Basename 04/28/12 2034  AST 22  ALT 21  ALKPHOS 94  BILITOT 0.4  PROT 7.0  ALBUMIN 3.3*    Basename 04/28/12 2034  WBC 12.1*  NEUTROABS 10.0*    HGB 15.7  HCT 46.4  MCV 89.1  PLT 149*    Radiological Exams on Admission: Dg Chest 2 View  04/28/2012  *RADIOLOGY REPORT*  Clinical Data: Short of breath  CHEST - 2 VIEW  Comparison: Chest radiograph 08/25 1011  Findings: Sternotomy wires overlie enlarged heart silhouette. There is a right lobe air space disease.  There is a left retrocardiac opacity also concerning for air space disease. No pneumothorax.  IMPRESSION:   Right upper lobe and left lower lobe pneumonia.   Original Report Authenticated By: Genevive Bi, M.D.     Assessment/Plan Present on Admission:  78 yo male with cap . PNA (pneumonia) . COPD exacerbation . HYPERTENSION . ANEMIA-IRON DEFICIENCY . Acute respiratory failure  Place in stepdown overnight due to significant wheezing and sob.  freq nebs.  Got one dose of solumedrol iv.  pna pathway.  Iv azithro and rocephine.  Ck flu.  If not improved over night would cont steroids in the am.  Ck abd u/s for ?abd mass ?hernia.  Pt is  DNR confirmed with son who helives with has yellow dnr paperwork.  Jaxton Casale A 04/28/2012, 10:08 PM

## 2012-04-28 NOTE — ED Provider Notes (Signed)
History     CSN: [id number]  Arrival date & time 04/28/12  1850   First MD Initiated Contact with Patient 04/28/12 1850      Chief Complaint  Patient presents with  . Shortness of Breath    (Consider location/radiation/quality/duration/timing/severity/associated sxs/prior treatment) HPI Patient is an 78 yo man with PMH COPD who presents with SOB which began yesterday. History provided by patient's son, as patient has dementia (patient lives at home, but has 24hr caregivers). SOB, wheezing, and productive cough have been noted by caregivers and family members for 1-2 days. Patient was reported to have no voiced complaints, and denies any complaints in the ED at time of exam. Per family members, patient has history of COPD, but no prior hospitalizations for COPD exacerbation, and no previous intubations.   Past Medical History  Diagnosis Date  . ADENOCARCINOMA, COLON, HX OF 09/21/2008  . ANEMIA-IRON DEFICIENCY 05/23/2009  . ANGIODYSPLASIA-INTESTINE 05/23/2009  . CHEST PAIN, PLEURITIC 11/21/2008  . COPD 02/01/2010  . DEPRESSION 09/21/2008  . FECAL OCCULT BLOOD 11/08/2009  . GERD 09/21/2008  . HYPERTENSION 11/01/2008  . INSOMNIA 04/23/2010  . Memory loss 01/08/2009  . 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 SUBDURAL HEMATOMA 09/21/2008  . Pulmonary collapse 06/11/2009  . UNSPECIFIED BACTERIAL PNEUMONIA 02/14/2010  . Aorta aneurysm     Past Surgical History  Procedure Date  . Cholecystectomy   . Aortic valve replacement   . Partial colectomy     cancered removed    Family History  Problem Relation Age of Onset  . Heart disease Father   . Prostate cancer Brother   . Cancer Father     kind unknown    History  Substance Use Topics  . Smoking status: Former Smoker -- 2.5 packs/day    Types: Cigarettes    Quit date: 06/23/1988  . Smokeless tobacco: Not on file  . Alcohol Use: No      Review of Systems  Unable to perform ROS   Allergies  Codeine sulfate  Home Medications    Current Outpatient Rx  Name  Route  Sig  Dispense  Refill  . ALBUTEROL SULFATE (2.5 MG/3ML) 0.083% IN NEBU   Nebulization   Take 2.5 mg by nebulization every 6 (six) hours as needed. For shortness of breath         . ASPIRIN 81 MG PO TABS   Oral   Take 81 mg by mouth daily.           . DONEPEZIL HCL 10 MG PO TABS      TAKE 1 TABLET BY MOUTH DAILY   30 tablet   6   . FERROUS SULFATE 325 (65 FE) MG PO TABS   Oral   Take 325 mg by mouth daily with breakfast.           . ONE-DAILY MULTI VITAMINS PO TABS   Oral   Take 1 tablet by mouth daily.           [name] OMEPRAZOLE 20 MG PO CPDR   Oral   Take 20 mg by mouth daily as needed. For GERD         . SERTRALINE HCL 100 MG PO TABS      TAKE 1 TABLET BY MOUTH EVERY DAY   30 tablet   11   . TIOTROPIUM BROMIDE MONOHYDRATE 18 MCG IN CAPS   Inhalation   Place 1 capsule (18 mcg total) into inhaler and inhale daily.   30 capsule  11     BP 138/67  Pulse 107  Temp 99.2 F (37.3 C) (Oral)  Resp 24  Ht 6' (1.829 m)  Wt 220 lb (99.791 kg)  BMI 29.84 kg/m2  SpO2 98%  Physical Exam  Constitutional: He appears well-developed and well-nourished. He appears distressed.  HENT:  Head: Normocephalic and atraumatic.  Eyes: Pupils are equal, round, and reactive to light.  Neck: No JVD present.  Cardiovascular: Intact distal pulses.  Tachycardia present.   Pulmonary/Chest: Accessory muscle usage present. Tachypnea noted. He has wheezes. He has rhonchi. He has no rales.  Abdominal: Soft. He exhibits no distension. There is no tenderness.  Neurological: He is alert. No cranial nerve deficit.  Skin: Skin is warm and dry. No rash noted.    ED Course  Procedures (including critical care time)  Labs Reviewed  CBC WITH DIFFERENTIAL - Abnormal; Notable for the following:    WBC 12.1 (*)     Platelets 149 (*)     Neutrophils Relative 82 (*)     Neutro Abs 10.0 (*)     Lymphocytes Relative 10 (*)     All other components  within normal limits  COMPREHENSIVE METABOLIC PANEL  CULTURE, BLOOD (ROUTINE X 2)  CULTURE, BLOOD (ROUTINE X 2)  LACTIC ACID, PLASMA   Dg Chest 2 View  04/28/2012  *RADIOLOGY REPORT*  Clinical Data: Short of breath  CHEST - 2 VIEW  Comparison: Chest radiograph 08/25 1011  Findings: Sternotomy wires overlie enlarged heart silhouette. There is a right lobe air space disease.  There is a left retrocardiac opacity also concerning for air space disease. No pneumothorax.  IMPRESSION:   Right upper lobe and left lower lobe pneumonia.   Original Report Authenticated By: Genevive Bi, M.D.    EKG: Regularly irregular, with rate ~100bpm. Normal axis. Normal ST/T segments.   1. Community acquired pneumonia       MDM  Patient has CAP with increased work of breathing and O2 desaturations to low 80s on ambulation. Pt given IV rocephin 1g and IV azithromycin 500mg . [facility]ists consulted for admission.         Elfredia Nevins, MD 04/28/12 2134

## 2012-04-29 ENCOUNTER — Encounter (HOSPITAL_COMMUNITY): Payer: Self-pay | Admitting: Neurology

## 2012-04-29 DIAGNOSIS — E860 Dehydration: Secondary | ICD-10-CM | POA: Diagnosis present

## 2012-04-29 DIAGNOSIS — F0390 Unspecified dementia without behavioral disturbance: Secondary | ICD-10-CM | POA: Diagnosis present

## 2012-04-29 DIAGNOSIS — D72829 Elevated white blood cell count, unspecified: Secondary | ICD-10-CM | POA: Diagnosis present

## 2012-04-29 DIAGNOSIS — K432 Incisional hernia without obstruction or gangrene: Secondary | ICD-10-CM | POA: Diagnosis present

## 2012-04-29 LAB — INFLUENZA PANEL BY PCR (TYPE A & B)
H1N1 flu by pcr: NOT DETECTED
Influenza A By PCR: NEGATIVE
Influenza B By PCR: NEGATIVE

## 2012-04-29 LAB — MRSA PCR SCREENING: MRSA by PCR: NEGATIVE

## 2012-04-29 LAB — LEGIONELLA ANTIGEN, URINE: Legionella Antigen, Urine: NEGATIVE

## 2012-04-29 LAB — STREP PNEUMONIAE URINARY ANTIGEN: Strep Pneumo Urinary Antigen: NEGATIVE

## 2012-04-29 MED ORDER — FERROUS SULFATE 325 (65 FE) MG PO TABS
325.0000 mg | ORAL_TABLET | Freq: Every day | ORAL | Status: DC
  Administered 2012-04-30 – 2012-05-01 (×2): 325 mg via ORAL
  Filled 2012-04-29 (×3): qty 1

## 2012-04-29 MED ORDER — SERTRALINE HCL 100 MG PO TABS
100.0000 mg | ORAL_TABLET | Freq: Every day | ORAL | Status: DC
  Administered 2012-04-29 – 2012-04-30 (×2): 100 mg via ORAL
  Filled 2012-04-29 (×3): qty 1

## 2012-04-29 MED ORDER — DONEPEZIL HCL 10 MG PO TABS
10.0000 mg | ORAL_TABLET | Freq: Every day | ORAL | Status: DC
  Administered 2012-04-29 – 2012-04-30 (×2): 10 mg via ORAL
  Filled 2012-04-29 (×3): qty 1

## 2012-04-29 NOTE — Progress Notes (Signed)
Nutrition Brief Note  Patient identified on the Malnutrition Screening Tool (MST) Report for unknown weight loss.  Per review of usual weight in EMR and per patient report, he has had no weight loss PTA.  RN reports patient is eating very well.  Wt Readings from Last 10 Encounters:  04/28/12 225 lb 8.5 oz (102.3 kg)  10/06/11 221 lb 3.2 oz (100.336 kg)  10/06/11 220 lb (99.791 kg)  04/07/11 212 lb (96.163 kg)  02/07/11 210 lb (95.255 kg)  12/06/10 208 lb (94.348 kg)  09/05/10 203 lb 8 oz (92.307 kg)  05/31/10 192 lb (87.091 kg)  04/23/10 181 lb (82.101 kg)  03/13/10 166 lb 4 oz (75.411 kg)    Body mass index is 30.59 kg/(m^2). Pt meets criteria for obesity, class I based on current BMI.   Current diet order is Heart Healthy, patient is consuming approximately 100% of meals at this time. Labs and medications reviewed.   No nutrition interventions warranted at this time. If nutrition issues arise, please consult RD.   Joaquin Courts, RD, LDN, CNSC Pager# [phone] After Hours Pager# [phone]

## 2012-04-29 NOTE — Progress Notes (Signed)
[facility] Progress Note [name]   Gerald Dyer ZOX:[id number] DOB: Dec 07, 1924 DOA: 04/28/2012 PCP: Gerald Covey, MD  Brief narrative: 78 year old male patient with known history of dementia and COPD who lives with his son. Brought to the hospital because of progressive dyspnea and wheezing for 2 days. Evaluation in the ER revealed pneumonia on chest x-ray. Patient has been coughing for several days but had not been experiencing any fevers, nausea, vomiting, or diarrhea. In addition to abnormal findings on chest x-ray patient also had leukocytosis without fever at presentation. He also appeared clinically dry. Incidental finding of her right upper quadrant incisional hernia that was nontender and did not appear to be incarcerated. Patient was subsequently admitted to the step down unit for further monitoring and treatment.  Assessment/Plan: Principal Problem: Acute respiratory failure with hypoxia due to: A)  *CAP (community acquired pneumonia)- bilateral *Initial chest x-ray revealed right upper lobe and left lower lobe pneumonia *Continue empiric Zithromax and Rocephin *Consideration should be given to possible component of aspiration given patient's underlying history-once adequately treated may benefit from formal swallowing evaluation  B)   COPD exacerbation *Currently not wheezing and suspect primary etiology to respiratory symptoms are from pneumonia *No indications to initiate steroid therapy at this time *Continue bronchodilator nebulizers with Spiriva *Give influenza injection during this admission  Active Problems:  Dehydration *Continue IV fluids at 75 cc per hour and follow electrolytes   HYPERTENSION *Currently well controlled *Was not on medications prior to admission    Leukocytosis *Related to acute pneumonia process and dehydration-follow CBC   CKD (chronic kidney disease) stage 3, GFR 30-59 ml/min *Has adequate  urinary output and creatinine appears to be stable and her previous baseline   ANEMIA-IRON DEFICIENCY *Continue home iron supplementation   Incisional hernia   Dementia *Continue home Aricept and Zoloft   DVT prophylaxis: SCDs Code Status: DO NOT RESUSCITATE Family Communication: Spoke with patient's son who was at the Disposition Plan: Transfer to medical floor  Consultants: None  Procedures: None  Antibiotics: Rocephin 11/6 >>> Zithromax 11/6 >>>  HPI/Subjective: Patient is alert and able to participate in the conversation but he is confused short-term events. No specific complaints of verbalized.   Objective: Blood pressure 103/75, pulse 83, temperature 97 F (36.1 C), temperature source Axillary, resp. rate 26, height 6' (1.829 m), weight 102.3 kg (225 lb 8.5 oz), SpO2 97.00%.  Intake/Output Summary (Last 24 hours) at 04/29/12 1140 Last data filed at 04/29/12 0900  Gross per 24 hour  Intake   1400 ml  Output   1200 ml  Net    200 ml     Exam: General: No acute respiratory distress Lungs: Left basilar crackles with right middle lung wheezing, nasal cannula oxygen, no tachypnea at rest Cardiovascular: Regular rate and rhythm without murmur gallop or rub normal S1 and S2, IV fluids at 75 cc per hour Abdomen: Nontender, nondistended, soft, bowel sounds positive, no rebound, no ascites, no appreciable mass, has right upper quadrant incisional hernia that is slightly distended and appears to be full so but is nontender and does not appear to be incarcerated Genitourinary: Condom catheter in place draining amber colored urine to bedside bag Musculoskeletal: No significant cyanosis, clubbing ofbilateral lower extremities Neurological: Patient is awake and alert, oriented times name and place, sitting upright in the chair, somewhat weak but exam nonfocal and no unilateral symptoms.  Data Reviewed: Basic Metabolic Panel:  Lab 04/28/12 8119  NA 136  K  3.8  CL 102    CO2 21  GLUCOSE 132*  BUN 17  CREATININE 1.16  CALCIUM 9.1  MG --  PHOS --   Liver Function Tests:  Lab 04/28/12 2034  AST 22  ALT 21  ALKPHOS 94  BILITOT 0.4  PROT 7.0  ALBUMIN 3.3*   No results found for this basename: LIPASE:5,AMYLASE:5 in the last 168 hours No results found for this basename: AMMONIA:5 in the last 168 hours CBC:  Lab 04/28/12 2034  WBC 12.1*  NEUTROABS 10.0*  HGB 15.7  HCT 46.4  MCV 89.1  PLT 149*   Cardiac Enzymes: No results found for this basename: CKTOTAL:5,CKMB:5,CKMBINDEX:5,TROPONINI:5 in the last 168 hours BNP (last 3 results) No results found for this basename: PROBNP:3 in the last 8760 hours CBG: No results found for this basename: GLUCAP:5 in the last 168 hours  Recent Results (from the past 240 hour(s))  MRSA PCR SCREENING     Status: Normal   Collection Time   04/28/12 11:35 PM      Component Value Range Status Comment   MRSA by PCR NEGATIVE  NEGATIVE Final      Studies:  Recent x-ray studies have been reviewed in detail by the Attending Physician  Scheduled Meds:  Reviewed in detail by the Attending Physician   Junious Silk, ANP [facility] Office  [phone] Pager [phone]  On-Call/Text Page:      [email or web address]      password [email or web address]  If 7PM-7AM, please contact night-coverage [email or web address] Password [email or web address] 04/29/2012, 11:40 AM   LOS: 1 day   I have examined the patient and reviewed the chart. I have modified the above note and agree with it.   Calvert Cantor, MD [phone]

## 2012-04-29 NOTE — Evaluation (Signed)
Seen and agree with SPT note Sheba Whaling Tabor Likisha Alles, PT [phone]  

## 2012-04-29 NOTE — Evaluation (Signed)
Physical Therapy Evaluation Patient Details Name: Gerald Dyer MRN: [id number] DOB: 10-Nov-1924 Today's Date: 04/29/2012 Time: 7829-5621 PT Time Calculation (min): 23 min  PT Assessment / Plan / Recommendation Clinical Impression  Admitted with PNA; Pt. is hard of hearing and possibly a poor historian. Pt. would benefit from acute PT at this time to address bed mobility, transfers, ambulation, and decreased activity tolerance so that pt. may return home safely. Pt. is impulsive and unsafe with DME although he has a RW at home and uses it for mobility. Pt. informed that HH will be recommened  upon discharge from hospital. Pt. educated on use of call bell and pt. stated that he would use it if he needed help.    PT Assessment  Patient needs continued PT services    Follow Up Recommendations  Home health PT       Barriers to Discharge None      Equipment Recommendations  None recommended by PT       Frequency Min 3X/week    Precautions / Restrictions Precautions Precautions: Fall Restrictions Weight Bearing Restrictions: No   Pertinent Vitals/Pain No pain reported HR 85 when PT entered; 96 during ambulation O2 92% on 3L (nasal cannula) when PT entered; 86% on room air therefore pt placed back on 3L; 90-95% on 3L during ambulation       Mobility  Bed Mobility Bed Mobility: Supine to Sit;Sitting - Scoot to Edge of Bed Supine to Sit: HOB elevated;4: Min assist;With rails Sitting - Scoot to [place] of Bed: 5: Supervision Details for Bed Mobility Assistance: Pt. required cueing for hand placement and sequencing of tasks. Transfers Transfers: Sit to Stand;Stand to Sit Sit to Stand: 4: Min assist;From bed Stand to Sit: 4: Min assist;With armrests;To chair/3-in-1 Details for Transfer Assistance: Pt. requires minA for safety, cueing for hand placement and sequencing of tasks and placement of RW. Pt. wants to push RW way out in front of him before standing. Pt. requires assistance  for safe and slow descent with stand -> sit.  Ambulation/Gait Ambulation/Gait Assistance: 4: Min assist Ambulation Distance (Feet): 75 Feet Assistive device: Rolling walker Ambulation/Gait Assistance Details: Pt. requires cueing to stay inside RW but does not follow directions well. Gait Pattern: Step-through pattern;Decreased stride length Gait velocity: decreased Stairs: No           PT Diagnosis: Difficulty walking  PT Problem List: Decreased activity tolerance;Decreased mobility;Decreased knowledge of use of DME;Decreased safety awareness PT Treatment Interventions: DME instruction;Gait training;Stair training;Functional mobility training;Therapeutic activities;Therapeutic exercise;Balance training;Patient/family education;Cognitive remediation   PT Goals Acute Rehab PT Goals PT Goal Formulation: With patient Time For Goal Achievement: 05/06/12 Potential to Achieve Goals: Good Pt will go Supine/Side to Sit: with modified independence PT Goal: Supine/Side to Sit - Progress: Goal set today Pt will go Sit to Stand: with modified independence PT Goal: Sit to Stand - Progress: Goal set today Pt will go Stand to Sit: with modified independence PT Goal: Stand to Sit - Progress: Goal set today Pt will Ambulate: >150 feet;with modified independence;with rolling walker PT Goal: Ambulate - Progress: Goal set today Pt will Perform Home Exercise Program: Independently PT Goal: Perform Home Exercise Program - Progress: Goal set today  Visit Information  Last PT Received On: 04/29/12 Assistance Needed: +1    Subjective Data  Subjective: "I can go home?" Patient Stated Goal: Return home and to walking around home for exercise   Prior Functioning  Home Living Lives With: Family (Chart says son but  pt. stated sister) Available Help at Discharge: Family (Pt. stated sister is retired and home most of the day) Type of Home: House Home Access: Stairs to enter [employer] of  Steps: 2 Entrance Stairs-Rails: Can reach both Home Layout: Two level;Able to live on main level with bedroom/bathroom Bathroom Shower/Tub: Health visitor: Standard Home Adaptive Equipment: Walker - rolling;Built-in shower seat;Grab bars around toilet;Grab bars in shower Additional Comments: Pts. charts states that he lives with son and pt. confirmed that when PT asked whom he lived with. Later pt. stated that he did not live with son but lived with sister. Prior Function Level of Independence: Independent Driving: Yes Vocation: Retired Musician: No difficulties    Cognition  Overall Cognitive Status: Impaired Area of Impairment: Following commands;Safety/judgement Arousal/Alertness: Awake/alert Orientation Level: Appears intact for tasks assessed Behavior During Session: [facility] for tasks performed Following Commands: Follows one step commands inconsistently Safety/Judgement: Decreased safety judgement for tasks assessed;Impulsive          End of Session PT - End of Session Equipment Utilized During Treatment: Gait belt Activity Tolerance: Patient limited by fatigue Patient left: in chair;with call bell/phone within reach;with nursing in room Nurse Communication: Mobility status    Army Chaco SPT 04/29/2012, 9:12 AM

## 2012-04-29 NOTE — Progress Notes (Signed)
Gerald Dyer is a 78 y.o. male patient who transferred  From 2600 awake, alert  & orientated  X 2, DNR, VSS - Blood pressure 150/65, pulse 82, temperature 98 F (36.7 C), temperature source Axillary, resp. rate 20, height 6' (1.829 m), weight 102.3 kg (225 lb 8.5 oz), SpO2 94.00%., O2   2 L nasal cannular, no c/o shortness of breath, no c/o chest pain, no distress noted.  IV site WDL: forearm right, condition patent and no redness with a transparent dsg that's clean dry and intact.  Allergies:   Allergies  Allergen Reactions  . Codeine Sulfate     Unknown     Past Medical History  Diagnosis Date  . ADENOCARCINOMA, COLON, HX OF 09/21/2008  . ANEMIA-IRON DEFICIENCY 05/23/2009  . ANGIODYSPLASIA-INTESTINE 05/23/2009  . CHEST PAIN, PLEURITIC 11/21/2008  . COPD 02/01/2010  . DEPRESSION 09/21/2008  . FECAL OCCULT BLOOD 11/08/2009  . GERD 09/21/2008  . HYPERTENSION 11/01/2008  . INSOMNIA 04/23/2010  . Memory loss 01/08/2009  . 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 SUBDURAL HEMATOMA 09/21/2008  . Pulmonary collapse 06/11/2009  . UNSPECIFIED BACTERIAL PNEUMONIA 02/14/2010  . Aorta aneurysm     Pt orientation to unit, room and routine. SR up x 2, fall risk assessment complete with Patient and family verbalizing understanding of risks associated with falls. Pt verbalizes an understanding of how to use the call bell and to call for help before getting out of bed.  Skin, clean-dry- intact without evidence of bruising, or skin tears.   No evidence of skin break down noted on exam.     Will cont to monitor and assist as needed.  Cindra Eves, RN 04/29/2012 6:40 PM

## 2012-04-30 DIAGNOSIS — J449 Chronic obstructive pulmonary disease, unspecified: Secondary | ICD-10-CM

## 2012-04-30 LAB — CBC
HCT: 47.7 % (ref 39.0–52.0)
MCH: 30.2 pg (ref 26.0–34.0)
MCHC: 33.3 g/dL (ref 30.0–36.0)
MCV: 90.5 fL (ref 78.0–100.0)
Platelets: 173 10*3/uL (ref 150–400)
RDW: 15 % (ref 11.5–15.5)
WBC: 10.5 10*3/uL (ref 4.0–10.5)

## 2012-04-30 LAB — BASIC METABOLIC PANEL
BUN: 20 mg/dL (ref 6–23)
Calcium: 9 mg/dL (ref 8.4–10.5)
Chloride: 108 mEq/L (ref 96–112)
Creatinine, Ser: 1.03 mg/dL (ref 0.50–1.35)
GFR calc Af Amer: 74 mL/min — ABNORMAL LOW (ref >90)

## 2012-04-30 MED ORDER — ALBUTEROL SULFATE (5 MG/ML) 0.5% IN NEBU
2.5000 mg | INHALATION_SOLUTION | Freq: Four times a day (QID) | RESPIRATORY_TRACT | Status: DC
  Administered 2012-04-30 – 2012-05-01 (×4): 2.5 mg via RESPIRATORY_TRACT
  Filled 2012-04-30 (×4): qty 0.5

## 2012-04-30 MED ORDER — ENOXAPARIN SODIUM 40 MG/0.4ML ~~LOC~~ SOLN
40.0000 mg | SUBCUTANEOUS | Status: DC
  Administered 2012-04-30: 40 mg via SUBCUTANEOUS
  Filled 2012-04-30 (×2): qty 0.4

## 2012-04-30 MED ORDER — LEVOFLOXACIN 750 MG PO TABS
750.0000 mg | ORAL_TABLET | Freq: Every day | ORAL | Status: DC
  Administered 2012-04-30 – 2012-05-01 (×2): 750 mg via ORAL
  Filled 2012-04-30 (×3): qty 1

## 2012-04-30 NOTE — Progress Notes (Signed)
SATURATION QUALIFICATIONS:  Patient Saturations on Room Air at Rest = 91%  Patient Saturations on Room Air while Ambulating = 87%  Patient Saturations on 3 Liters of oxygen while Ambulating = 91%  Statement of medical necessity for home oxygen:

## 2012-04-30 NOTE — Evaluation (Signed)
Clinical/Bedside Swallow Evaluation Patient Details  Name: Gerald Dyer MRN: [id number] Date of Birth: Dec 21, 1924  Today's Date: 04/30/2012 Time: 9562-1308 SLP Time Calculation (min): 46 min  Past Medical History:  Past Medical History  Diagnosis Date  . ADENOCARCINOMA, COLON, HX OF 09/21/2008  . ANEMIA-IRON DEFICIENCY 05/23/2009  . ANGIODYSPLASIA-INTESTINE 05/23/2009  . CHEST PAIN, PLEURITIC 11/21/2008  . COPD 02/01/2010  . DEPRESSION 09/21/2008  . FECAL OCCULT BLOOD 11/08/2009  . GERD 09/21/2008  . HYPERTENSION 11/01/2008  . INSOMNIA 04/23/2010  . Memory loss 01/08/2009  . 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 SUBDURAL HEMATOMA 09/21/2008  . Pulmonary collapse 06/11/2009  . UNSPECIFIED BACTERIAL PNEUMONIA 02/14/2010  . Aorta aneurysm    Past Surgical History:  Past Surgical History  Procedure Date  . Cholecystectomy   . Aortic valve replacement   . Partial colectomy     cancered removed   HPI:  78 yo adm to [facility] with cough, Pt found to have right lower lobe and left lower lobe pna.  PMH + for COPD, GERD, SDH, bacterial pna, memory loss, chest pain 11/2008, AA, neoplasm of digestive system s/p surgery per pt - no rad or chemo.  Pt takes Prilosec but also reports using Tums once a day at home for breakthrough reflux.     Assessment / Plan / Recommendation Clinical Impression  Pt without CN deficit, observed to be eating with  HOB at approx 30* upon entrance to room.  Advised pt to eat with HOB fully upright.  Pt with timely swallow and clear voice throughout but he does exhibit increased WOB with energy expenditure which may result in episodic aspiration.  Do not suspect pt with overt aspiration with each bolus consumed. Pt advised SLP that he is normally short of breath at baseline.  Dry cough observed with and without po intake.    Frequent belching noted which pt states is baseline and he takes Protonix and tums at home to manage those symptoms.  He reports taking Tums everyday - once  a day.  Suspect incr asp risk due to pt's reflux, chronic burping.    Advised pt to aspiration precautions given his COPD and his rapid rate of eating and to reflux precautions:  provided in writing with SLP contact number.  Pt denies problems swallowing currently.      SLP to sign off, please reorder if desire.  Thanks.     Aspiration Risk  Mild    Diet Recommendation Regular;Thin liquid   Liquid Administration via: Straw;Cup Supervision: Patient able to self feed Compensations: Slow rate;Small sips/bites;Check for pocketing Postural Changes and/or Swallow Maneuvers: Seated upright 90 degrees;Upright 30-60 min after meal    Other  Recommendations Oral Care Recommendations: Other (Comment)   Follow Up Recommendations  None    Frequency and Duration        Pertinent Vitals/Pain Afebrile, decreased - rhonchi    SLP Swallow Goals  n/a   Swallow Study Prior Functional Status       General Date of Onset: 04/30/12 HPI: 78 yo adm to [facility] with cough, Pt found to have right lower lobe and left lower lobe pna.  PMH + for COPD, GERD, SDH, bacterial pna, memory loss, chest pain 11/2008, AA, neoplasm of digestive system s/p surgery per pt - no rad or chemo.  Pt takes Prilosec but also reports using Tums once a day at home for breakthrough reflux.   Previous Swallow Assessment: MBS 2009- results not available:  pt reports he had heart  valve replacement at that time Diet Prior to this Study: Regular;Thin liquids Temperature Spikes Noted: No Respiratory Status: Supplemental O2 delivered via (comment) (pt states he is no on oxygen at home) History of Recent Intubation: No Behavior/Cognition: Alert;Cooperative;Pleasant mood Oral Cavity - Dentition: Edentulous Self-Feeding Abilities: Able to feed self Patient Positioning: Other (comment) (found pt eating with HOB lowered to 30*) Baseline Vocal Quality: Clear Volitional Cough: Weak Volitional Swallow: Able to elicit    Oral/Motor/Sensory  Function Overall Oral Motor/Sensory Function: Appears within functional limits for tasks assessed   Ice Chips Ice chips: Not tested   Thin Liquid Thin Liquid: Within functional limits Presentation: Cup;Self Fed;Straw    Nectar Thick Nectar Thick Liquid: Not tested   Honey Thick Honey Thick Liquid: Not tested   Puree Puree: Within functional limits Presentation: Self Fed;Spoon Other Comments: pt eats at rapid rate   Solid   GO    Presentation: Self Fed Other Comments: cracker:  pt eats at rapid rate- minimal oral residual of egg noted in mouth after meal - cleared with cue to swallow       Donavan Burnet, MS [facility] SLP [phone]

## 2012-04-30 NOTE — Progress Notes (Signed)
[facility]  Patient Status: New  [facility] is providing the following services: RN and PT  If patient discharges after hours, please call [phone].   Gerald Dyer 04/30/2012, 4:38 PM

## 2012-04-30 NOTE — Progress Notes (Signed)
Pt's sons expressed concern as to whether current level of care they provide for her in her home is sufficient after discharge. Order place for case manager consult.

## 2012-04-30 NOTE — Progress Notes (Signed)
PATIENT DETAILS Name: Gerald Dyer Age: 78 y.o. Sex: male Date of Birth: 04-06-1925 Admit Date: 04/28/2012 Admitting Physician Tarry Kos, MD ZOX:[id number],WJXBJ W, MD  Subjective: No major issues overnight  Assessment/Plan: Principal Problem:  *CAP (community acquired pneumonia)- bilateral -afebrile -no leukocytosis -change to oral levaquin -mobilize -appreciate SLP eval  Active Problems: COPD -some minimal rhonchi -will place on albuterol nebs -c/w Spiriva -titrate off O2   ANEMIA-IRON DEFICIENCY -Hb actually normal -continue with Fe SO4 till seen by PCP   HYPERTENSION -controlled without need for anti-hypertensives   Acute respiratory failure with hypoxia -likely 2/2 PNA -titrate off O2   Dementia -stable -c/w Aricept and Zoloft   Leukocytosis -resolved -2/2 to PNA   Dehydration -resolved with IVF   CKD (chronic kidney disease) stage 3, GFR 30-59 ml/min -at baseline  Disposition: Remain inpatient  DVT Prophylaxis: Prophylactic Lovenox  Code Status:  DNR  Procedures:  None  CONSULTS:  None  PHYSICAL EXAM: Vital signs in last 24 hours: Filed Vitals:   04/29/12 2114 04/29/12 2115 04/30/12 0501 04/30/12 0900  BP: 176/92  154/78   Pulse: 92  73   Temp: 97.8 F (36.6 C)  97.6 F (36.4 C)   TempSrc: Oral  Oral   Resp: 22  20   Height:      Weight:      SpO2: 87% 93% 93% 94%    Weight change:  Body mass index is 30.59 kg/(m^2).   Gen Exam: Awake and alert with clear speech.   Neck: Supple, No JVD.   Chest: B/L Clear.  Some scattered rhonchi CVS: S1 S2 Regular, no murmurs.  Abdomen: soft, BS +, non tender, non distended.  Extremities: no edema, lower extremities warm to touch Neurologic: Non Focal.   Skin: No Rash.   Wounds: N/A.    Intake/Output from previous day:  Intake/Output Summary (Last 24 hours) at 04/30/12 1212 Last data filed at 04/29/12 2116  Gross per 24 hour  Intake     50 ml  Output   1250 ml  Net   -1200 ml     LAB RESULTS: CBC  Lab 04/30/12 0520 04/28/12 2034  WBC 10.5 12.1*  HGB 15.9 15.7  HCT 47.7 46.4  PLT 173 149*  MCV 90.5 89.1  MCH 30.2 30.1  MCHC 33.3 33.8  RDW 15.0 14.8  LYMPHSABS -- 1.3  MONOABS -- 0.8  EOSABS -- 0.1  BASOSABS -- 0.0  BANDABS -- --    Chemistries   Lab 04/30/12 0520 04/28/12 2034  NA 141 136  K 4.4 3.8  CL 108 102  CO2 23 21  GLUCOSE 99 132*  BUN 20 17  CREATININE 1.03 1.16  CALCIUM 9.0 9.1  MG -- --    CBG: No results found for this basename: GLUCAP:5 in the last 168 hours  GFR Estimated Creatinine Clearance: 64.9 ml/min (by C-G formula based on Cr of 1.03).  Coagulation profile No results found for this basename: INR:5,PROTIME:5 in the last 168 hours  Cardiac Enzymes No results found for this basename: CK:3,CKMB:3,TROPONINI:3,MYOGLOBIN:3 in the last 168 hours  No components found with this basename: POCBNP:3 No results found for this basename: DDIMER:2 in the last 72 hours No results found for this basename: HGBA1C:2 in the last 72 hours No results found for this basename: CHOL:2,HDL:2,LDLCALC:2,TRIG:2,CHOLHDL:2,LDLDIRECT:2 in the last 72 hours No results found for this basename: TSH,T4TOTAL,FREET3,T3FREE,THYROIDAB in the last 72 hours No results found for this basename: VITAMINB12:2,FOLATE:2,FERRITIN:2,TIBC:2,IRON:2,RETICCTPCT:2 in the last 72 hours No results  found for this basename: LIPASE:2,AMYLASE:2 in the last 72 hours  Urine Studies No results found for this basename: UACOL:2,UAPR:2,USPG:2,UPH:2,UTP:2,UGL:2,UKET:2,UBIL:2,UHGB:2,UNIT:2,UROB:2,ULEU:2,UEPI:2,UWBC:2,URBC:2,UBAC:2,CAST:2,CRYS:2,UCOM:2,BILUA:2 in the last 72 hours  MICROBIOLOGY: Recent Results (from the past 240 hour(s))  CULTURE, BLOOD (ROUTINE X 2)     Status: Normal (Preliminary result)   Collection Time   04/28/12  8:31 PM      Component Value Range Status Comment   Specimen Description BLOOD RIGHT ARM   Final    Special Requests BOTTLES DRAWN  AEROBIC AND ANAEROBIC 10CC   Final    Culture  Setup Time 04/29/2012 03:40   Final    Culture     Final    Value:        BLOOD CULTURE RECEIVED NO GROWTH TO DATE CULTURE WILL BE HELD FOR 5 DAYS BEFORE ISSUING A FINAL NEGATIVE REPORT   Report Status PENDING   Incomplete   CULTURE, BLOOD (ROUTINE X 2)     Status: Normal (Preliminary result)   Collection Time   04/28/12  8:39 PM      Component Value Range Status Comment   Specimen Description BLOOD RIGHT HAND   Final    Special Requests BOTTLES DRAWN AEROBIC AND ANAEROBIC 10CC   Final    Culture  Setup Time 04/29/2012 03:40   Final    Culture     Final    Value:        BLOOD CULTURE RECEIVED NO GROWTH TO DATE CULTURE WILL BE HELD FOR 5 DAYS BEFORE ISSUING A FINAL NEGATIVE REPORT   Report Status PENDING   Incomplete   MRSA PCR SCREENING     Status: Normal   Collection Time   04/28/12 11:35 PM      Component Value Range Status Comment   MRSA by PCR NEGATIVE  NEGATIVE Final     RADIOLOGY STUDIES/RESULTS: Dg Chest 2 View  04/28/2012  *RADIOLOGY REPORT*  Clinical Data: Short of breath  CHEST - 2 VIEW  Comparison: Chest radiograph 08/25 1011  Findings: Sternotomy wires overlie enlarged heart silhouette. There is a right lobe air space disease.  There is a left retrocardiac opacity also concerning for air space disease. No pneumothorax.  IMPRESSION:   Right upper lobe and left lower lobe pneumonia.   Original Report Authenticated By: Genevive Bi, M.D.     MEDICATIONS: Scheduled Meds:   . donepezil  10 mg Oral QHS  . ferrous sulfate  325 mg Oral Q breakfast  . [COMPLETED] influenza  inactive virus vaccine  0.5 mL Intramuscular Tomorrow-1000  . levofloxacin  750 mg Oral Daily  . sertraline  100 mg Oral QHS  . tiotropium  1 capsule Inhalation Daily  . [DISCONTINUED] azithromycin  500 mg Intravenous Q24H  . [DISCONTINUED] cefTRIAXone (ROCEPHIN)  IV  1 g Intravenous Q24H   Continuous Infusions:  PRN  Meds:.albuterol  Antibiotics: Anti-infectives     Start     Dose/Rate Route Frequency Ordered Stop   04/30/12 1000   levofloxacin (LEVAQUIN) tablet 750 mg        750 mg Oral Daily 04/30/12 0908     04/29/12 1000   cefTRIAXone (ROCEPHIN) 1 g in dextrose 5 % 50 mL IVPB  Status:  Discontinued        1 g 100 mL/hr over 30 Minutes Intravenous Every 24 hours 04/28/12 2255 04/30/12 0908   04/29/12 0800   azithromycin (ZITHROMAX) 500 mg in dextrose 5 % 250 mL IVPB  Status:  Discontinued  500 mg 250 mL/hr over 60 Minutes Intravenous Every 24 hours 04/28/12 2255 04/30/12 0908   04/28/12 2115   cefTRIAXone (ROCEPHIN) 1 g in dextrose 5 % 50 mL IVPB        1 g 100 mL/hr over 30 Minutes Intravenous  Once 04/28/12 2113 04/28/12 2208   04/28/12 2115   azithromycin (ZITHROMAX) 500 mg in dextrose 5 % 250 mL IVPB        500 mg 250 mL/hr over 60 Minutes Intravenous  Once 04/28/12 2113 04/28/12 2329           Jeoffrey Massed, MD  [facility] Pager:[phone]  If 7PM-7AM, please contact night-coverage [email or web address] Password [email or web address] 04/30/2012, 12:12 PM   LOS: 2 days

## 2012-04-30 NOTE — Care Management Note (Unsigned)
    Page 1 of 1   04/30/2012     3:58:34 PM   CARE MANAGEMENT NOTE 04/30/2012  Patient:  Gerald Dyer, Gerald Dyer   Account Number:  [id number]  Date Initiated:  04/30/2012  Documentation initiated by:  Letha Cape  Subjective/Objective Assessment:   dx pna  admit - lives with son.     Action/Plan:   pt eval- rec hhpt   Anticipated DC Date:  05/03/2012   Anticipated DC Plan:  HOME W HOME HEALTH SERVICES      DC Planning Services  CM consult      [facility] Choice  HOME HEALTH   Choice offered to / List presented to:  C-1 Patient        HH arranged  HH-1 RN  HH-2 PT      [facility] agency  [facility]   Status of service:  In process, will continue to follow Medicare Important Message given?   (If response is "NO", the following Medicare IM given date fields will be blank) Date Medicare IM given:   Date Additional Medicare IM given:    Discharge Disposition:    Per UR Regulation:  Reviewed for med. necessity/level of care/duration of stay  If discussed at Long Length of Stay Meetings, dates discussed:    Comments:  04/30/12 12:34 Letha Cape RN, BSN [phone] patient lives with son, per physical therapy recs hhpt, patient [facility] , referral made to [facility], Lupita Leash notified for hhrn for pna and hhpt.  Soc will begin 24-48 hrs post discharge.

## 2012-05-01 DIAGNOSIS — E860 Dehydration: Secondary | ICD-10-CM

## 2012-05-01 MED ORDER — ALBUTEROL SULFATE (2.5 MG/3ML) 0.083% IN NEBU: 2.5000 mg | INHALATION_SOLUTION | Freq: Four times a day (QID) | RESPIRATORY_TRACT | Status: DC

## 2012-05-01 MED ORDER — METHYLPREDNISOLONE SODIUM SUCC 125 MG IJ SOLR
80.0000 mg | Freq: Once | INTRAMUSCULAR | Status: DC
  Filled 2012-05-01: qty 1.28

## 2012-05-01 MED ORDER — PREDNISONE 10 MG PO TABS: ORAL_TABLET | ORAL | Status: DC

## 2012-05-01 MED ORDER — ALBUTEROL SULFATE (5 MG/ML) 0.5% IN NEBU: 2.5000 mg | INHALATION_SOLUTION | RESPIRATORY_TRACT | Status: DC | PRN

## 2012-05-01 MED ORDER — LEVOFLOXACIN 750 MG PO TABS: 750.0000 mg | ORAL_TABLET | Freq: Every day | ORAL | Status: DC

## 2012-05-01 MED ORDER — ALBUTEROL SULFATE HFA 108 (90 BASE) MCG/ACT IN AERS: 2.0000 | INHALATION_SPRAY | Freq: Four times a day (QID) | RESPIRATORY_TRACT | Status: DC

## 2012-05-01 MED ORDER — ALBUTEROL SULFATE HFA 108 (90 BASE) MCG/ACT IN AERS
2.0000 | INHALATION_SPRAY | Freq: Four times a day (QID) | RESPIRATORY_TRACT | Status: DC
  Filled 2012-05-01: qty 6.7

## 2012-05-01 NOTE — Progress Notes (Signed)
NURSING PROGRESS NOTE  Gerald Dyer [id number] Discharge Data: 05/01/2012 12:43 PM Attending Provider: Maretta Bees, MD WUJ:WJXBJYNWG,NFAOZ W, MD     Daiva Eves to be D/C'd Home per MD order.  Discussed with the son the After Visit Summary and all questions fully answered. All IV's discontinued with no bleeding noted. All belongings returned to patient for patient to take home.   Last Vital Signs:  Blood pressure 129/57, pulse 74, temperature 98 F (36.7 C), temperature source Oral, resp. rate 20, height 6' (1.829 m), weight 102.3 kg (225 lb 8.5 oz), SpO2 95.00%.  Discharge Medication List   Medication List     As of 05/01/2012 12:43 PM    TAKE these medications         albuterol (2.5 MG/3ML) 0.083% nebulizer solution   Commonly known as: PROVENTIL   Take 3 mLs (2.5 mg total) by nebulization 4 (four) times daily. For shortness of breath      albuterol (5 MG/ML) 0.5% nebulizer solution   Commonly known as: PROVENTIL   Take 0.5 mLs (2.5 mg total) by nebulization every 2 (two) hours as needed for wheezing or shortness of breath.      albuterol 108 (90 BASE) MCG/ACT inhaler   Commonly known as: PROVENTIL HFA;VENTOLIN HFA   Inhale 2 puffs into the lungs 4 (four) times daily.      aspirin 81 MG tablet   Take 81 mg by mouth daily.      donepezil 10 MG tablet   Commonly known as: ARICEPT   TAKE 1 TABLET BY MOUTH DAILY      ferrous sulfate 325 (65 FE) MG tablet   Take 325 mg by mouth daily with breakfast.      levofloxacin 750 MG tablet   Commonly known as: LEVAQUIN   Take 1 tablet (750 mg total) by mouth daily.      multivitamin tablet   Take 1 tablet by mouth daily.      omeprazole 20 MG capsule   Commonly known as: PRILOSEC   Take 20 mg by mouth daily as needed. For GERD      predniSONE 10 MG tablet   Commonly known as: DELTASONE   Take 4 tablets daily for 2 days, then  Take 3 tablets daily for 2 days, then   Take 2 tablets daily for 2 days,  then  Take 1 tablet for 1 day and then stop      sertraline 100 MG tablet   Commonly known as: ZOLOFT   TAKE 1 TABLET BY MOUTH EVERY DAY      tiotropium 18 MCG inhalation capsule   Commonly known as: SPIRIVA   Place 1 capsule (18 mcg total) into inhaler and inhale daily.        Bolden Hagerman, Elmarie Mainland, RN

## 2012-05-01 NOTE — Discharge Summary (Signed)
PATIENT DETAILS Name: Gerald Dyer Age: 78 y.o. Sex: male Date of Birth: 03-25-1925 MRN: [id number]. Admit Date: 04/28/2012 Admitting Physician: Tarry Kos, MD WGN:[id number],HQION W, MD  Recommendations for Outpatient Follow-up:  1. Repeat Chest XRay in 6 weeks to document resolution of the infiltrate  PRIMARY DISCHARGE DIAGNOSIS:  Principal Problem:  *CAP (community acquired pneumonia)- bi;ateral Active Problems:  ANEMIA-IRON DEFICIENCY  HYPERTENSION  COPD exacerbation  Acute respiratory failure with hypoxia  Incisional hernia  Dementia  Leukocytosis  Dehydration  CKD (chronic kidney disease) stage 3, GFR 30-59 ml/min      PAST MEDICAL HISTORY: Past Medical History  Diagnosis Date  . ADENOCARCINOMA, COLON, HX OF 09/21/2008  . ANEMIA-IRON DEFICIENCY 05/23/2009  . ANGIODYSPLASIA-INTESTINE 05/23/2009  . CHEST PAIN, PLEURITIC 11/21/2008  . COPD 02/01/2010  . DEPRESSION 09/21/2008  . FECAL OCCULT BLOOD 11/08/2009  . GERD 09/21/2008  . HYPERTENSION 11/01/2008  . INSOMNIA 04/23/2010  . Memory loss 01/08/2009  . 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 SUBDURAL HEMATOMA 09/21/2008  . Pulmonary collapse 06/11/2009  . UNSPECIFIED BACTERIAL PNEUMONIA 02/14/2010  . Aorta aneurysm     DISCHARGE MEDICATIONS:   Medication List     As of 05/01/2012 11:05 AM    TAKE these medications         albuterol (2.5 MG/3ML) 0.083% nebulizer solution   Commonly known as: PROVENTIL   Take 3 mLs (2.5 mg total) by nebulization 4 (four) times daily. For shortness of breath      albuterol (5 MG/ML) 0.5% nebulizer solution   Commonly known as: PROVENTIL   Take 0.5 mLs (2.5 mg total) by nebulization every 2 (two) hours as needed for wheezing or shortness of breath.      aspirin 81 MG tablet   Take 81 mg by mouth daily.      donepezil 10 MG tablet   Commonly known as: ARICEPT   TAKE 1 TABLET BY MOUTH DAILY      ferrous sulfate 325 (65 FE) MG tablet   Take 325 mg by mouth daily with breakfast.        levofloxacin 750 MG tablet   Commonly known as: LEVAQUIN   Take 1 tablet (750 mg total) by mouth daily.      multivitamin tablet   Take 1 tablet by mouth daily.      omeprazole 20 MG capsule   Commonly known as: PRILOSEC   Take 20 mg by mouth daily as needed. For GERD      predniSONE 10 MG tablet   Commonly known as: DELTASONE   Take 4 tablets daily for 2 days, then  Take 3 tablets daily for 2 days, then   Take 2 tablets daily for 2 days, then  Take 1 tablet for 1 day and then stop      sertraline 100 MG tablet   Commonly known as: ZOLOFT   TAKE 1 TABLET BY MOUTH EVERY DAY      tiotropium 18 MCG inhalation capsule   Commonly known as: SPIRIVA   Place 1 capsule (18 mcg total) into inhaler and inhale daily.         BRIEF HPI:  See H&P, Labs, Consult and Test reports for all details in brief, 78 yo male h/o copd, dementia lives with his son who has been having 2 days of worsenign sob and wheezing. Son bought him to ED and has pna on xray. No recent abx use. Coughing for several days no fevers/n/v/d.   CONSULTATIONS:   None  PERTINENT RADIOLOGIC STUDIES: Dg Chest 2 View  04/28/2012  *RADIOLOGY REPORT*  Clinical Data: Short of breath  CHEST - 2 VIEW  Comparison: Chest radiograph 08/25 1011  Findings: Sternotomy wires overlie enlarged heart silhouette. There is a right lobe air space disease.  There is a left retrocardiac opacity also concerning for air space disease. No pneumothorax.  IMPRESSION:   Right upper lobe and left lower lobe pneumonia.   Original Report Authenticated By: Genevive Bi, M.D.      PERTINENT LAB RESULTS: CBC:  Basename 04/30/12 0520 04/28/12 2034  WBC 10.5 12.1*  HGB 15.9 15.7  HCT 47.7 46.4  PLT 173 149*   CMET CMP     Component Value Date/Time   NA 141 04/30/2012 0520   K 4.4 04/30/2012 0520   CL 108 04/30/2012 0520   CO2 23 04/30/2012 0520   GLUCOSE 99 04/30/2012 0520   BUN 20 04/30/2012 0520   CREATININE 1.03 04/30/2012 0520    CALCIUM 9.0 04/30/2012 0520   PROT 7.0 04/28/2012 2034   ALBUMIN 3.3* 04/28/2012 2034   AST 22 04/28/2012 2034   ALT 21 04/28/2012 2034   ALKPHOS 94 04/28/2012 2034   BILITOT 0.4 04/28/2012 2034   GFRNONAA 64* 04/30/2012 0520   GFRAA 74* 04/30/2012 0520    GFR Estimated Creatinine Clearance: 64.9 ml/min (by C-G formula based on Cr of 1.03). No results found for this basename: LIPASE:2,AMYLASE:2 in the last 72 hours No results found for this basename: CKTOTAL:3,CKMB:3,CKMBINDEX:3,TROPONINI:3 in the last 72 hours No components found with this basename: POCBNP:3 No results found for this basename: DDIMER:2 in the last 72 hours No results found for this basename: HGBA1C:2 in the last 72 hours No results found for this basename: CHOL:2,HDL:2,LDLCALC:2,TRIG:2,CHOLHDL:2,LDLDIRECT:2 in the last 72 hours No results found for this basename: TSH,T4TOTAL,FREET3,T3FREE,THYROIDAB in the last 72 hours No results found for this basename: VITAMINB12:2,FOLATE:2,FERRITIN:2,TIBC:2,IRON:2,RETICCTPCT:2 in the last 72 hours Coags: No results found for this basename: PT:2,INR:2 in the last 72 hours Microbiology: Recent Results (from the past 240 hour(s))  CULTURE, BLOOD (ROUTINE X 2)     Status: Normal (Preliminary result)   Collection Time   04/28/12  8:31 PM      Component Value Range Status Comment   Specimen Description BLOOD RIGHT ARM   Final    Special Requests BOTTLES DRAWN AEROBIC AND ANAEROBIC 10CC   Final    Culture  Setup Time 04/29/2012 03:40   Final    Culture     Final    Value:        BLOOD CULTURE RECEIVED NO GROWTH TO DATE CULTURE WILL BE HELD FOR 5 DAYS BEFORE ISSUING A FINAL NEGATIVE REPORT   Report Status PENDING   Incomplete   CULTURE, BLOOD (ROUTINE X 2)     Status: Normal (Preliminary result)   Collection Time   04/28/12  8:39 PM      Component Value Range Status Comment   Specimen Description BLOOD RIGHT HAND   Final    Special Requests BOTTLES DRAWN AEROBIC AND ANAEROBIC 10CC   Final      Culture  Setup Time 04/29/2012 03:40   Final    Culture     Final    Value:        BLOOD CULTURE RECEIVED NO GROWTH TO DATE CULTURE WILL BE HELD FOR 5 DAYS BEFORE ISSUING A FINAL NEGATIVE REPORT   Report Status PENDING   Incomplete   MRSA PCR SCREENING     Status: Normal  Collection Time   04/28/12 11:35 PM      Component Value Range Status Comment   MRSA by PCR NEGATIVE  NEGATIVE Final      BRIEF HOSPITAL COURSE:   Principal Problem:  *CAP (community acquired pneumonia)- bilateral -continues to clinically improve, on admission was admitted to SDU, but upon improvement, was transferred to the floor. He initially was on Rocephin, Zithromax, this was transitioned to oral Levaquin. He remains afebrile and with no leukocytosis. SLP did evaluate this patient-as there was some concern for Aspiration-no obvious aspiration found. Since he is clinically better, he will be discharged home, with HHPT and RN. He will need a repeat CXR in 6 weeks to document resolution of the infiltrate  Active Problems: COPD  -some minimal rhonchi, will start tapering prednisone -c/w albuterol nebs  -c/w Spiriva  -ambulatory O2 Screen was done-patient will require Home 02  HYPERTENSION  -controlled without need for anti-hypertensives   ANEMIA-IRON DEFICIENCY  -Hb actually normal  -continue with Fe SO4 till seen by PCP  Acute on chronic respiratory failure with hypoxia  -likely 2/2 PNA  -will require O2 on discharge  Dementia  -stable  -c/w Aricept and Zoloft   Leukocytosis  -resolved  -2/2 to PNA   Dehydration  -resolved with IVF   CKD (chronic kidney disease) stage 3, GFR 30-59 ml/min  -at baseline    TODAY-DAY OF DISCHARGE:  Subjective:   Rich Brave today has no headache,no chest abdominal pain,no new weakness tingling or numbness, feels much better wants to go home today. He still has cough-but claims to be much better.  Objective:   Blood pressure 129/57, pulse 74,  temperature 98 F (36.7 C), temperature source Oral, resp. rate 20, height 6' (1.829 m), weight 102.3 kg (225 lb 8.5 oz), SpO2 95.00%.  Intake/Output Summary (Last 24 hours) at 05/01/12 1105 Last data filed at 05/01/12 0618  Gross per 24 hour  Intake    480 ml  Output    950 ml  Net   -470 ml    Exam Awake Alert, Oriented *3, No new F.N deficits, Normal affect [place].AT,PERRAL Supple Neck,No JVD, No cervical lymphadenopathy appriciated.  Symmetrical Chest wall movement, Good air movement bilaterally, some scattered rhonchi present. RRR,No Gallops,Rubs or new Murmurs, No Parasternal Heave +ve B.Sounds, Abd Soft, Non tender, No organomegaly appriciated, No rebound -guarding or rigidity. No Cyanosis, Clubbing or edema, No new Rash or bruise  DISCHARGE CONDITION: Stable  DISPOSITION: HOME with home health services  DISCHARGE INSTRUCTIONS:    Activity:  As tolerated with Full fall precautions use walker/cane & assistance as needed  Diet recommendation: Heart Healthy diet  Code Status DNR       Follow-up Information    Follow up with Kristian Covey, MD. Schedule an appointment as soon as possible for a visit in 1 week.   Contact information:   [address] Christena Flake [place] [address] [phone]            Total Time spent on discharge equals 45 minutes.  SignedJeoffrey Massed 05/01/2012 11:05 AM

## 2012-05-01 NOTE — Progress Notes (Signed)
NCM spoke to pt's son, [name]. NCM made son aware to call [facility] as soon as they arrive home to deliver home oxygen. Notified [facility] for nebulizer needed for home and [facility] for scheduled d/c home today. [facility] contact info added to d/c instructions. Isidoro Donning RN CCM Case Mgmt phone [phone]

## 2012-05-05 ENCOUNTER — Other Ambulatory Visit: Payer: Self-pay | Admitting: Family Medicine

## 2012-05-05 LAB — CULTURE, BLOOD (ROUTINE X 2): Culture: NO GROWTH

## 2012-05-06 ENCOUNTER — Other Ambulatory Visit: Payer: Self-pay | Admitting: *Deleted

## 2012-05-06 MED ORDER — TIOTROPIUM BROMIDE MONOHYDRATE 18 MCG IN CAPS: 1.0000 | ORAL_CAPSULE | Freq: Every day | RESPIRATORY_TRACT | Status: DC

## 2012-05-07 ENCOUNTER — Telehealth: Payer: Self-pay | Admitting: Family Medicine

## 2012-05-07 NOTE — Telephone Encounter (Signed)
Set up appt for next week

## 2012-05-07 NOTE — Telephone Encounter (Signed)
Nelva Bush, would you assist with scheduling this pt with Dr Caryl Never sometime next week.  Thank you

## 2012-05-07 NOTE — Telephone Encounter (Signed)
Caller: Arboriculturist; Patient Name: Gerald Dyer; PCP: Evelena Peat [facility]); Best Callback Phone Number: [phone] Son reports Patient was admitted to [facility] on Wed., Nov. 6th, 2013 and released on Sat, NOv. 9th for dagnosis of pneumonia.  Patient was sent home on Two liters of O2 continuous. nebulizer and antibiotics. Also, he was  assigned a nurse from [facility] and Physical Therapist. Nurse saw patient yesterday ( 11/14) and advised son to contact clinic and ask  Dr. Caryl Never to review patient's hospitalization notes and advise when patient should follow up in clinic for recent hospitalization.  There are no immediate concerns at this time. Son mentioned that Physical therapist said  patient's oxygen levels drop to 90 with activity. Will forward this message to Dr. Caryl Never.

## 2012-05-10 NOTE — Telephone Encounter (Signed)
lmom for pt to call back

## 2012-05-11 ENCOUNTER — Telehealth: Payer: Self-pay | Admitting: Family Medicine

## 2012-05-11 ENCOUNTER — Other Ambulatory Visit: Payer: Self-pay | Admitting: Family Medicine

## 2012-05-11 NOTE — Telephone Encounter (Signed)
Start back neb until we see him on Thursday.

## 2012-05-11 NOTE — Telephone Encounter (Signed)
Physical therapist, Gerald Dyer, calling with update on Gerald Dyer - she states pt is coming to see [place] Thursday. Was dx in hospital w/ pneumonia an COPD. Pt D/C'd on 11/9. He is new to home O2 - compliant with that. Gerald Dyer states he is just getting weaker, not stronger - yesterday he was wheezing a lot. Today a little better.  She isn't sure why, but states his family took him off the nebulizer. Also, they had not been giving him his inhaler consistently, but after she spoke with them, they know what they need to do w/the inhaler. [facility] nurse is also coming to the home, but PT felt we needed to know. Please advise.  States that if we DO want Gerald Dyer to be using the nebulizer, the best person to call is his son, Gerald Dyer [phone]).

## 2012-05-11 NOTE — Telephone Encounter (Signed)
Gerald Dyer informed, BC/BS first of 2014 need to change pharmacy from [facility] to [facility]

## 2012-05-12 NOTE — Telephone Encounter (Signed)
Pt has appt tomorrow

## 2012-05-13 ENCOUNTER — Ambulatory Visit (INDEPENDENT_AMBULATORY_CARE_PROVIDER_SITE_OTHER): Payer: Medicare Other | Admitting: Family Medicine

## 2012-05-13 ENCOUNTER — Encounter: Payer: Self-pay | Admitting: Family Medicine

## 2012-05-13 VITALS — BP 120/70 | Temp 97.6°F | Wt 231.0 lb

## 2012-05-13 DIAGNOSIS — J189 Pneumonia, unspecified organism: Secondary | ICD-10-CM

## 2012-05-13 DIAGNOSIS — J449 Chronic obstructive pulmonary disease, unspecified: Secondary | ICD-10-CM

## 2012-05-13 NOTE — Progress Notes (Signed)
  Subjective:    Patient ID: Gerald Dyer, male    DOB: 1925-04-27, 78 y.o.   MRN: [id number]  HPI  Patient seen for hospital followup. He was admitted on 04/28/2012 with Community acquired pneumonia. He presented with some cough and wheezing. History of COPD. Blood cultures negative. X-rays revealed pneumonia right upper lobe and left lower lobe. Treated initially with Rocephin and Zithromax and subsequently Levaquin. Discharged with some prednisone and his usual inhalers including Spiriva and albuterol. Patient also discharged home oxygen 2 L nasal cannula. Compliant with use.  Not quite back to baseline. Occasional cough mostly clear sputum. No recurrent fever. Good appetite. No problems with ambulation. Received flu vaccine during hospitalization.  Past Medical History  Diagnosis Date  . ADENOCARCINOMA, COLON, HX OF 09/21/2008  . ANEMIA-IRON DEFICIENCY 05/23/2009  . ANGIODYSPLASIA-INTESTINE 05/23/2009  . CHEST PAIN, PLEURITIC 11/21/2008  . COPD 02/01/2010  . DEPRESSION 09/21/2008  . FECAL OCCULT BLOOD 11/08/2009  . GERD 09/21/2008  . HYPERTENSION 11/01/2008  . INSOMNIA 04/23/2010  . Memory loss 01/08/2009  . 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 SUBDURAL HEMATOMA 09/21/2008  . Pulmonary collapse 06/11/2009  . UNSPECIFIED BACTERIAL PNEUMONIA 02/14/2010  . Aorta aneurysm    Past Surgical History  Procedure Date  . Cholecystectomy   . Aortic valve replacement   . Partial colectomy     cancered removed    reports that he quit smoking about 23 years ago. His smoking use included Cigarettes. He smoked 2.5 packs per day. He does not have any smokeless tobacco history on file. He reports that he does not drink alcohol or use illicit drugs. family history includes Cancer in his father; Heart disease in his father; and Prostate cancer in his brother. Allergies  Allergen Reactions  . Codeine Sulfate     Unknown      Review of Systems  Constitutional: Negative for fever, chills and  fatigue.  Respiratory: Positive for cough and shortness of breath. Negative for wheezing.   Cardiovascular: Negative for chest pain.  Gastrointestinal: Negative for nausea and vomiting.  Skin: Positive for pallor.  Neurological: Negative for dizziness.       Objective:   Physical Exam  Constitutional: He appears well-developed and well-nourished.       Patient has somewhat flat affect but cooperative  Cardiovascular: Normal rate and regular rhythm.   Pulmonary/Chest:       Diminished breath sounds throughout. No rales. No wheezes  Abdominal: Soft. There is no tenderness.  Musculoskeletal: He exhibits no edema.  Neurological: He is alert.          Assessment & Plan:  Recent bilateral community-acquired pneumonia in a patient with COPD. O2 sats today on 2 L nasal cannula 93%. Continue oxygen. Continue Spiriva. Consider chest x-ray at followup in 4 weeks. Followup promptly for any recurrent fever.

## 2012-05-20 ENCOUNTER — Other Ambulatory Visit: Payer: Self-pay | Admitting: Family Medicine

## 2012-05-25 ENCOUNTER — Telehealth: Payer: Self-pay | Admitting: Family Medicine

## 2012-05-25 NOTE — Telephone Encounter (Signed)
Signed prescription

## 2012-05-25 NOTE — Telephone Encounter (Signed)
Caller: Gerald Dyer/Patient; Phone: [phone]; Reason for Call: [name]/Daughter calling.  Patient is on 2L/min O2 at all times.  Has [facility] for nursing care.  States RN told the family he needed a pulse oximeter in the home.  States did not offer to bring this, and the pharmacist told family they could take a prescription to a medical supply house.  Caller also states she and her sister in law Gerald Dyer are also on the most recent [employer] form, but this is not seen in Epic.  Info to office for staff/provider review/Rx/[facility] order.

## 2012-05-25 NOTE — Telephone Encounter (Signed)
Caller Kimberly informed Rx is ready to pick-up, diag code for COPD: 496

## 2012-05-31 ENCOUNTER — Other Ambulatory Visit: Payer: Self-pay | Admitting: Family Medicine

## 2012-06-18 ENCOUNTER — Telehealth: Payer: Self-pay | Admitting: Family Medicine

## 2012-06-18 NOTE — Telephone Encounter (Signed)
informed

## 2012-06-18 NOTE — Telephone Encounter (Signed)
OK to get social work consult.

## 2012-06-18 NOTE — Telephone Encounter (Signed)
Becky from [facility] is requesting a Child psychotherapist to visit patient for hospice care ect. Please call becky for verbal order if its ok

## 2012-10-07 ENCOUNTER — Telehealth: Payer: Self-pay | Admitting: Family Medicine

## 2012-10-07 DIAGNOSIS — J449 Chronic obstructive pulmonary disease, unspecified: Secondary | ICD-10-CM

## 2012-10-07 NOTE — Telephone Encounter (Signed)
We can set up home health but he will need to be seen here at some point if we are to do any further evaluation to determine what is going on.  At his age with dementia and other medical problems family will need to discuss further interventions for evaluation vs comfort care.

## 2012-10-07 NOTE — Telephone Encounter (Signed)
Patient Information:  Caller Name: Selena Batten (daughter-In-Law)  Phone: [phone]  Patient: Gerald Dyer, Gerald Dyer  Gender: Male  DOB: 1925-03-16  Age: 79 Years  PCP: Evelena Peat (Family Practice)  Office Follow Up:  Does the office need to follow up with this patient?: Yes  Instructions For The Office: OFFICE PLEASE FOLLOW UP WITH CALLER IF PT CAN HAVE HOME HEALTH COME OUT TO EVALUATE HIM VS PT BEING SEEN IN THE OFFICE.  RN Note:  Jethro Bolus is wanting to know if patient can have an order to get home health to come out to see patient vs patient coming into office.  [facility] # [phone]  Symptoms  Reason For Call & Symptoms: onset 10/06/12 with blood in toliet.  Blood is passing with stool.  Pt does have hemorrhoids.  Pt is a two time cancer survivor.  Caller is wanting to know if order can be made for [facility] to come see patient (pt was released from them in Feb 2014)  Reviewed Health History In EMR: Yes  Reviewed Medications In EMR: Yes  Reviewed Allergies In EMR: Yes  Reviewed Surgeries / Procedures: Yes  Date of Onset of Symptoms: 10/06/2012  Guideline(s) Used:  Rectal Bleeding  Disposition Per Guideline:   See Today in Office  Reason For Disposition Reached:   History of cancer of rectum or intestines (colon)  Advice Given:  N/A  Patient Refused Recommendation:  Patient Refused Care Advice  Caller is requesting an order to have [facility] evaluate patient vs pt being seen in the office.

## 2012-10-07 NOTE — Telephone Encounter (Signed)
Daughter in [place], [place] informed.  Home Health referral done, biggest reason being pt is refusing to wear the O2 post hospital stay following pneumonia.  Informed he will need to be seen here if rectal bleeding continues.  [name] voiced her understanding

## 2012-10-11 ENCOUNTER — Encounter: Payer: Self-pay | Admitting: *Deleted

## 2012-10-27 ENCOUNTER — Other Ambulatory Visit: Payer: Self-pay | Admitting: Family Medicine

## 2012-10-31 ENCOUNTER — Other Ambulatory Visit: Payer: Self-pay | Admitting: Family Medicine

## 2012-11-03 ENCOUNTER — Telehealth: Payer: Self-pay | Admitting: Family Medicine

## 2012-11-03 NOTE — Telephone Encounter (Signed)
Patient's son called stating that he need an rx for a lift chair and also for depends. Please assist.

## 2012-11-03 NOTE — Telephone Encounter (Signed)
I left a message for son to please call back to schedule OV for his father.  Most likely they are going to need a face-to-face visit to justify request for lift chair.

## 2012-11-04 NOTE — Telephone Encounter (Signed)
Left another message for son [name] re: what is needed for lift chair, OV

## 2012-11-09 NOTE — Telephone Encounter (Signed)
Phone number for Gerald [phone], This number has a VM that has not been set up yet".  On pt chart another son, [name] [phone] was called and it was a orthopedic office?  I left a VM on the mobile number for [name] to pleas try to bring his father in for OV so we can address the request and have the proper documentation so that pt insurance will assist in payment.

## 2012-12-10 ENCOUNTER — Other Ambulatory Visit: Payer: Self-pay | Admitting: Family Medicine

## 2013-05-01 ENCOUNTER — Other Ambulatory Visit: Payer: Self-pay | Admitting: Pulmonary Disease

## 2013-05-01 ENCOUNTER — Other Ambulatory Visit: Payer: Self-pay | Admitting: Family Medicine

## 2013-05-03 NOTE — Telephone Encounter (Signed)
Last ov 09/2011 with KC, with return in 1 year.  No appts pending.  Will refill with note to pharmacy that pt is overdue for appt.

## 2013-05-20 ENCOUNTER — Ambulatory Visit (INDEPENDENT_AMBULATORY_CARE_PROVIDER_SITE_OTHER): Payer: Medicare Other | Admitting: Family Medicine

## 2013-05-20 ENCOUNTER — Encounter: Payer: Self-pay | Admitting: Family Medicine

## 2013-05-20 VITALS — BP 126/68 | HR 81 | Temp 97.4°F | Wt 240.0 lb

## 2013-05-20 DIAGNOSIS — R635 Abnormal weight gain: Secondary | ICD-10-CM

## 2013-05-20 DIAGNOSIS — E669 Obesity, unspecified: Secondary | ICD-10-CM | POA: Insufficient documentation

## 2013-05-20 DIAGNOSIS — J449 Chronic obstructive pulmonary disease, unspecified: Secondary | ICD-10-CM

## 2013-05-20 DIAGNOSIS — R413 Other amnesia: Secondary | ICD-10-CM

## 2013-05-20 DIAGNOSIS — Z85038 Personal history of other malignant neoplasm of large intestine: Secondary | ICD-10-CM

## 2013-05-20 DIAGNOSIS — Z23 Encounter for immunization: Secondary | ICD-10-CM

## 2013-05-20 DIAGNOSIS — K219 Gastro-esophageal reflux disease without esophagitis: Secondary | ICD-10-CM

## 2013-05-20 DIAGNOSIS — I10 Essential (primary) hypertension: Secondary | ICD-10-CM

## 2013-05-20 LAB — BASIC METABOLIC PANEL
BUN: 16 mg/dL (ref 6–23)
CO2: 27 mEq/L (ref 19–32)
Calcium: 9.5 mg/dL (ref 8.4–10.5)
Creatinine, Ser: 1.2 mg/dL (ref 0.4–1.5)
GFR: 60.41 mL/min (ref >60.00)
Glucose, Bld: 138 mg/dL — ABNORMAL HIGH (ref 70–99)
Sodium: 140 mEq/L (ref 135–145)

## 2013-05-20 LAB — CBC WITH DIFFERENTIAL/PLATELET
Basophils Absolute: 0 10*3/uL (ref 0.0–0.1)
Eosinophils Absolute: 0.2 10*3/uL (ref 0.0–0.7)
Hemoglobin: 16.5 g/dL (ref 13.0–17.0)
Lymphocytes Relative: 18.6 % (ref 12.0–46.0)
MCHC: 33.5 g/dL (ref 30.0–36.0)
Monocytes Relative: 5 % (ref 3.0–12.0)
Neutro Abs: 7 10*3/uL (ref 1.4–7.7)
Platelets: 190 10*3/uL (ref 150.0–400.0)
RDW: 14.9 % — ABNORMAL HIGH (ref 11.5–14.6)

## 2013-05-20 LAB — HEPATIC FUNCTION PANEL
Albumin: 3.9 g/dL (ref 3.5–5.2)
Bilirubin, Direct: 0.1 mg/dL (ref 0.0–0.3)
Total Protein: 7.1 g/dL (ref 6.0–8.3)

## 2013-05-20 LAB — CEA: CEA: 1.2 ng/mL (ref 0.0–5.0)

## 2013-05-20 NOTE — Progress Notes (Signed)
Pre visit review using our clinic review tool, if applicable. No additional management support is needed unless otherwise documented below in the visit note. 

## 2013-05-20 NOTE — Progress Notes (Signed)
Subjective:    Patient ID: Gerald Dyer, male    DOB: 06/12/1925, 79 y.o.   MRN: [id number]  HPI  Patient seen for followup He has multiple chronic problems including history of COPD, remote history of large subdural hematoma, obesity, history of colon cancer, hypertension, GERD, history of depression, dementia Accompanied by son. Patient is a poor historian secondary to memory deficits and cognitive impairment. He remains on medications which are outlined. Stable from respiratory standpoint. They have home oxygen but or not using at all this point. Son has questions regarding whether he needs this at night. At rest, he does not have any obvious dyspnea. He uses Spiriva regularly and as needed albuterol. He remains on omeprazole for GERD and those symptoms are well controlled.  His son states that he drinks Gatorade regularly through the day and this may account for some of his weight gain. His depression treated with sertraline and this has been stable.  Past Medical History  Diagnosis Date  . ADENOCARCINOMA, COLON, HX OF 09/21/2008  . ANEMIA-IRON DEFICIENCY 05/23/2009  . ANGIODYSPLASIA-INTESTINE 05/23/2009  . CHEST PAIN, PLEURITIC 11/21/2008  . COPD 02/01/2010  . DEPRESSION 09/21/2008  . FECAL OCCULT BLOOD 11/08/2009  . GERD 09/21/2008  . HYPERTENSION 11/01/2008  . INSOMNIA 04/23/2010  . Memory loss 01/08/2009  . 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 SUBDURAL HEMATOMA 09/21/2008  . Pulmonary collapse 06/11/2009  . UNSPECIFIED BACTERIAL PNEUMONIA 02/14/2010  . Aorta aneurysm    Past Surgical History  Procedure Laterality Date  . Cholecystectomy    . Aortic valve replacement    . Partial colectomy      cancered removed    reports that he quit smoking about 24 years ago. His smoking use included Cigarettes. He smoked 2.50 packs per day. He does not have any smokeless tobacco history on file. He reports that he does not drink alcohol or use illicit drugs. family history includes Cancer  in his father; Heart disease in his father; Prostate cancer in his brother. Allergies  Allergen Reactions  . Codeine Sulfate     Unknown     Review of Systems  Constitutional: Positive for fatigue. Negative for unexpected weight change.  Eyes: Negative for visual disturbance.  Respiratory: Negative for cough, chest tightness and shortness of breath.   Cardiovascular: Negative for chest pain, palpitations and leg swelling.  Neurological: Negative for dizziness, syncope, weakness, light-headedness and headaches.       Objective:   Physical Exam  Constitutional: He appears well-developed and well-nourished.  HENT:  Right Ear: External ear normal.  Left Ear: External ear normal.  Mouth/Throat: Oropharynx is clear and moist.  Neck: Neck supple.  Cardiovascular: Normal rate and regular rhythm.   Pulmonary/Chest: Effort normal and breath sounds normal. No respiratory distress. He has no wheezes. He has no rales.  Musculoskeletal: He exhibits edema.  Neurological: He is alert.  Skin: No rash noted.  Psychiatric: He has a normal mood and affect.          Assessment & Plan:  #1 COPD. Family has concerns regarding whether he needs oxygen. His current pulse oximetry at rest RA is 94%. We explained he could be dropping at night and there are specific questions regarding whether he needs night oxygen. We will recommend overnight pulse oximetry to help assess #2 history of dementia. Continue Aricept. No wandering behavior or agitation. #3 history of GERD well-controlled on omeprazole #4 history of chronic kidney disease. Recheck basic metabolic panel #5 hx adenocarcinoma of  colon. He is apparently not a candidate for any further surgeries per surgeon.  Check labs -CBC, hepatic panel, and CEA #6 health maintenance. Prevnar 13 given. Received flu vaccine just couple weeks ago. #7 weight gain.  Check TSH.  Discussed with son reduction in calories. He is very inactive.

## 2013-06-04 ENCOUNTER — Other Ambulatory Visit: Payer: Self-pay | Admitting: Family Medicine

## 2013-06-09 ENCOUNTER — Telehealth: Payer: Self-pay | Admitting: Family Medicine

## 2013-06-09 MED ORDER — DONEPEZIL HCL 10 MG PO TABS: ORAL_TABLET | ORAL | Status: DC

## 2013-06-09 NOTE — Telephone Encounter (Signed)
RX sent to pharmacy  

## 2013-06-09 NOTE — Telephone Encounter (Signed)
Pt's son calling on his behalf to request donepezil (ARICEPT) 10 MG tablet to be sent to [facility] [place], son states pharmacy is saying the rx was denied.

## 2013-08-03 ENCOUNTER — Other Ambulatory Visit: Payer: Self-pay | Admitting: Pulmonary Disease

## 2013-08-03 ENCOUNTER — Other Ambulatory Visit: Payer: Self-pay | Admitting: Family Medicine

## 2013-08-18 ENCOUNTER — Emergency Department (HOSPITAL_COMMUNITY)
Admission: EM | Admit: 2013-08-18 | Discharge: 2013-08-18 | Disposition: A | Discharge status: Home / Self Care | Payer: Medicare Other | Attending: Emergency Medicine | Admitting: Emergency Medicine

## 2013-08-18 ENCOUNTER — Encounter (HOSPITAL_COMMUNITY): Payer: Self-pay | Admitting: Emergency Medicine

## 2013-08-18 ENCOUNTER — Emergency Department (HOSPITAL_COMMUNITY): Payer: Medicare Other

## 2013-08-18 DIAGNOSIS — Z87891 Personal history of nicotine dependence: Secondary | ICD-10-CM | POA: Insufficient documentation

## 2013-08-18 DIAGNOSIS — Y92009 Unspecified place in unspecified non-institutional (private) residence as the place of occurrence of the external cause: Secondary | ICD-10-CM | POA: Insufficient documentation

## 2013-08-18 DIAGNOSIS — Y939 Activity, unspecified: Secondary | ICD-10-CM | POA: Insufficient documentation

## 2013-08-18 DIAGNOSIS — IMO0002 Reserved for concepts with insufficient information to code with codable children: Secondary | ICD-10-CM | POA: Insufficient documentation

## 2013-08-18 DIAGNOSIS — K219 Gastro-esophageal reflux disease without esophagitis: Secondary | ICD-10-CM | POA: Insufficient documentation

## 2013-08-18 DIAGNOSIS — F329 Major depressive disorder, single episode, unspecified: Secondary | ICD-10-CM | POA: Insufficient documentation

## 2013-08-18 DIAGNOSIS — Z9089 Acquired absence of other organs: Secondary | ICD-10-CM | POA: Insufficient documentation

## 2013-08-18 DIAGNOSIS — Z79899 Other long term (current) drug therapy: Secondary | ICD-10-CM | POA: Insufficient documentation

## 2013-08-18 DIAGNOSIS — Z7982 Long term (current) use of aspirin: Secondary | ICD-10-CM | POA: Insufficient documentation

## 2013-08-18 DIAGNOSIS — D509 Iron deficiency anemia, unspecified: Secondary | ICD-10-CM | POA: Insufficient documentation

## 2013-08-18 DIAGNOSIS — F0390 Unspecified dementia without behavioral disturbance: Secondary | ICD-10-CM | POA: Insufficient documentation

## 2013-08-18 DIAGNOSIS — S8010XA Contusion of unspecified lower leg, initial encounter: Secondary | ICD-10-CM | POA: Insufficient documentation

## 2013-08-18 DIAGNOSIS — S8000XA Contusion of unspecified knee, initial encounter: Secondary | ICD-10-CM

## 2013-08-18 DIAGNOSIS — Z8701 Personal history of pneumonia (recurrent): Secondary | ICD-10-CM | POA: Insufficient documentation

## 2013-08-18 DIAGNOSIS — Z85038 Personal history of other malignant neoplasm of large intestine: Secondary | ICD-10-CM | POA: Insufficient documentation

## 2013-08-18 DIAGNOSIS — W19XXXA Unspecified fall, initial encounter: Secondary | ICD-10-CM | POA: Insufficient documentation

## 2013-08-18 DIAGNOSIS — I10 Essential (primary) hypertension: Secondary | ICD-10-CM | POA: Insufficient documentation

## 2013-08-18 DIAGNOSIS — F3289 Other specified depressive episodes: Secondary | ICD-10-CM | POA: Insufficient documentation

## 2013-08-18 DIAGNOSIS — J449 Chronic obstructive pulmonary disease, unspecified: Secondary | ICD-10-CM | POA: Insufficient documentation

## 2013-08-18 DIAGNOSIS — J4489 Other specified chronic obstructive pulmonary disease: Secondary | ICD-10-CM | POA: Insufficient documentation

## 2013-08-18 MED ORDER — HYDROCODONE-ACETAMINOPHEN 5-325 MG PO TABS: 2.0000 | ORAL_TABLET | ORAL | Status: DC | PRN

## 2013-08-18 NOTE — ED Notes (Addendum)
Quarter size abrasions noted bilaterally to knees. No bleeding noted. Family denies any change in pt mental status after fall.

## 2013-08-18 NOTE — ED Notes (Signed)
Per family, pt had unwitnessed fall at home while at home with his caregiver yesterday. Pt c/o left knee pain. No other injuries noted. Pt has full ROM. Difficulty standing from wheelchair.

## 2013-08-18 NOTE — ED Provider Notes (Signed)
CSN: [id number]     Arrival date & time 08/18/13  1155 History   First MD Initiated Contact with Patient 08/18/13 1204     Chief Complaint  Patient presents with  . Fall  . Knee Pain     (Consider location/radiation/quality/duration/timing/severity/associated sxs/prior Treatment) HPI Comments: Patient is an 80 year old male with past medical history of dementia. He presents for evaluation of bilateral knee pain. He apparently fell yesterday at home and the presence of his caregivers. He has been having discomfort in both knees since that time. He denies having struck his head and there is no reported loss of consciousness. He has no other complaints. History is somewhat limited due to his history of dementia.  Patient is a 80 y.o. male presenting with fall. The history is provided by the patient.  Fall This is a new problem. The current episode started yesterday. The problem occurs constantly. The problem has not changed since onset.Pertinent negatives include no chest pain, no abdominal pain, no headaches and no shortness of breath. The symptoms are aggravated by walking. Nothing relieves the symptoms. He has tried nothing for the symptoms. The treatment provided no relief.    Past Medical History  Diagnosis Date  . ADENOCARCINOMA, COLON, HX OF 09/21/2008  . ANEMIA-IRON DEFICIENCY 05/23/2009  . ANGIODYSPLASIA-INTESTINE 05/23/2009  . CHEST PAIN, PLEURITIC 11/21/2008  . COPD 02/01/2010  . DEPRESSION 09/21/2008  . FECAL OCCULT BLOOD 11/08/2009  . GERD 09/21/2008  . HYPERTENSION 11/01/2008  . INSOMNIA 04/23/2010  . Memory loss 01/08/2009  . 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 SUBDURAL HEMATOMA 09/21/2008  . Pulmonary collapse 06/11/2009  . UNSPECIFIED BACTERIAL PNEUMONIA 02/14/2010  . Aorta aneurysm    Past Surgical History  Procedure Laterality Date  . Cholecystectomy    . Aortic valve replacement    . Partial colectomy      cancered removed   Family History  Problem Relation Age of  Onset  . Heart disease Father   . Prostate cancer Brother   . Cancer Father     kind unknown   History  Substance Use Topics  . Smoking status: Former Smoker -- 2.50 packs/day    Types: Cigarettes    Quit date: 06/23/1988  . Smokeless tobacco: Not on file  . Alcohol Use: No    Review of Systems  Respiratory: Negative for shortness of breath.   Cardiovascular: Negative for chest pain.  Gastrointestinal: Negative for abdominal pain.  Neurological: Negative for headaches.  All other systems reviewed and are negative.      Allergies  Codeine sulfate  Home Medications   Current Outpatient Rx  Name  Route  Sig  Dispense  Refill  . albuterol (PROVENTIL HFA;VENTOLIN HFA) 108 (90 BASE) MCG/ACT inhaler   Inhalation   Inhale 2 puffs into the lungs 4 (four) times daily.         [name] albuterol (PROVENTIL) (2.5 MG/3ML) 0.083% nebulizer solution      USE 1 VIAL IN NEBULIZER 4 TIMES DAILY FOR SHORTNESS OF BREATH   75 mL   3   . aspirin 81 MG tablet   Oral   Take 81 mg by mouth daily.           [name] donepezil (ARICEPT) 10 MG tablet      TAKE 1 TABLET BY MOUTH DAILY   30 tablet   11   . ferrous sulfate 325 (65 FE) MG tablet   Oral   Take 325 mg by mouth daily with breakfast.           .  Multiple Vitamin (MULTIVITAMIN) tablet   Oral   Take 1 tablet by mouth daily.           . NON FORMULARY      Oxygen per nasal canula, 2 liters         . omeprazole (PRILOSEC) 20 MG capsule      TAKE ONE CAPSULE BY MOUTH EVERY DAY   30 capsule   5   . sertraline (ZOLOFT) 100 MG tablet      TAKE 1 TABLET BY MOUTH EVERY DAY   30 tablet   5   . SPIRIVA HANDIHALER 18 MCG inhalation capsule      INHALE CONTENTS OF 1 CAPSULE ONCE EVERY DAY   30 capsule   6    There were no vitals taken for this visit. Physical Exam  Nursing note and vitals reviewed. Constitutional: He is oriented to person, place, and time. He appears well-developed and well-nourished. No distress.   HENT:  Head: Normocephalic and atraumatic.  Mouth/Throat: Oropharynx is clear and moist.  Eyes: Pupils are equal, round, and reactive to light.  Neck: Normal range of motion. Neck supple.  There is no cervical spine tenderness to palpation or step-offs. He has painless range of motion in all directions.  Cardiovascular: Normal rate, regular rhythm and normal heart sounds.   No murmur heard. Pulmonary/Chest: Effort normal and breath sounds normal. No respiratory distress. He has no wheezes.  Abdominal: Soft. Bowel sounds are normal. He exhibits no distension. There is no tenderness.  Musculoskeletal: He exhibits no edema.  Both knees are noted to have abrasions to the anterior aspects. There is no palpable effusion and otherwise no gross abnormality. He has good range of motion. There is minimal laxity with varus and valgus stress bilaterally. Anterior drawer and posterior drawer tests are negative bilaterally.  Neurological: He is alert and oriented to person, place, and time. No cranial nerve deficit. Coordination normal.  Skin: Skin is warm and dry. He is not diaphoretic.    ED Course  Procedures (including critical care time) Labs Review Labs Reviewed - No data to display Imaging Review No results found.    MDM   Final diagnoses:  None    Patient brought for evaluation of bilateral knee abrasions and pain after a fall. He has a history of dementia and adds little history. He appears to have good range of motion of both knees and imaging studies are unremarkable. To exam, his knees appear stable. At this point I do not feel as though any further studies are indicated and will discharge him to home with when necessary followup.    Veryl Speak, MD 08/18/13 1352

## 2013-08-18 NOTE — ED Notes (Signed)
MD at bedside. 

## 2013-08-18 NOTE — ED Notes (Signed)
Transported to XR  

## 2013-08-18 NOTE — ED Notes (Signed)
MD at bedside for triage and assessment.

## 2013-08-18 NOTE — Discharge Instructions (Signed)
Ibuprofen 600 mg every 6 hours as needed for pain.  Hydrocodone as prescribed as needed for pain not relieved with ibuprofen.  Return to the ER for new and concerning symptoms, and followup with your Dr. if not improving in the next week.   Contusion A contusion is a deep bruise. Contusions are the result of an injury that caused bleeding under the skin. The contusion may turn blue, purple, or yellow. Minor injuries will give you a painless contusion, but more severe contusions may stay painful and swollen for a few weeks.  CAUSES  A contusion is usually caused by a blow, trauma, or direct force to an area of the body. SYMPTOMS   Swelling and redness of the injured area.  Bruising of the injured area.  Tenderness and soreness of the injured area.  Pain. DIAGNOSIS  The diagnosis can be made by taking a history and physical exam. An X-ray, CT scan, or MRI may be needed to determine if there were any associated injuries, such as fractures. TREATMENT  Specific treatment will depend on what area of the body was injured. In general, the best treatment for a contusion is resting, icing, elevating, and applying cold compresses to the injured area. Over-the-counter medicines may also be recommended for pain control. Ask your caregiver what the best treatment is for your contusion. HOME CARE INSTRUCTIONS   Put ice on the injured area.  Put ice in a plastic bag.  Place a towel between your skin and the bag.  Leave the ice on for 15-20 minutes, 03-04 times a day.  Only take over-the-counter or prescription medicines for pain, discomfort, or fever as directed by your caregiver. Your caregiver may recommend avoiding anti-inflammatory medicines (aspirin, ibuprofen, and naproxen) for 48 hours because these medicines may increase bruising.  Rest the injured area.  If possible, elevate the injured area to reduce swelling. SEEK IMMEDIATE MEDICAL CARE IF:   You have increased bruising or  swelling.  You have pain that is getting worse.  Your swelling or pain is not relieved with medicines. MAKE SURE YOU:   Understand these instructions.  Will watch your condition.  Will get help right away if you are not doing well or get worse. Document Released: 03/19/2005 Document Revised: 09/01/2011 Document Reviewed: 04/14/2011 [facility] Patient Information 2014 [place], [place].

## 2013-12-01 ENCOUNTER — Other Ambulatory Visit: Payer: Self-pay | Admitting: Family Medicine

## 2013-12-03 ENCOUNTER — Other Ambulatory Visit: Payer: Self-pay | Admitting: Family Medicine

## 2014-02-24 ENCOUNTER — Other Ambulatory Visit: Payer: Self-pay | Admitting: Pulmonary Disease

## 2014-03-02 ENCOUNTER — Ambulatory Visit (INDEPENDENT_AMBULATORY_CARE_PROVIDER_SITE_OTHER): Payer: Medicare Other | Admitting: Family Medicine

## 2014-03-02 ENCOUNTER — Encounter: Payer: Self-pay | Admitting: Family Medicine

## 2014-03-02 VITALS — BP 130/70 | HR 80 | Temp 97.4°F | Wt 240.0 lb

## 2014-03-02 DIAGNOSIS — J449 Chronic obstructive pulmonary disease, unspecified: Secondary | ICD-10-CM

## 2014-03-02 DIAGNOSIS — K219 Gastro-esophageal reflux disease without esophagitis: Secondary | ICD-10-CM

## 2014-03-02 DIAGNOSIS — Z23 Encounter for immunization: Secondary | ICD-10-CM

## 2014-03-02 DIAGNOSIS — J4489 Other specified chronic obstructive pulmonary disease: Secondary | ICD-10-CM

## 2014-03-02 DIAGNOSIS — I10 Essential (primary) hypertension: Secondary | ICD-10-CM

## 2014-03-02 DIAGNOSIS — F0390 Unspecified dementia without behavioral disturbance: Secondary | ICD-10-CM

## 2014-03-02 MED ORDER — TIOTROPIUM BROMIDE MONOHYDRATE 18 MCG IN CAPS: 18.0000 ug | ORAL_CAPSULE | Freq: Every day | RESPIRATORY_TRACT | Status: DC

## 2014-03-02 NOTE — Progress Notes (Signed)
   Subjective:    Patient ID: Gerald Dyer, male    DOB: [birth year], 80 y.o.   MRN: [id number]  HPI Medical followup.  Patient has remote history of adenocarcinoma:, COPD, mild chronic kidney disease, dementia, history of depression, GERD. He has also had previous history of large subdural hematoma many years ago. He has excellent family support and has a Actuary every day. He has refused home oxygen in the past but does state his pulse oximetry at home frequently dipping to the mid 80s. He continues to refuse oxygen. He takes Spiriva regularly and needs refills. Needs flu vaccine.  Had one fall last winter but none since then. GERD controlled with omeprazole. Mood has been stable. Denies chest pain. Good appetite. Weight stable. Surgeon has indicated that he is not a candidate for further abdominal surgery.  Past Medical History  Diagnosis Date  . ADENOCARCINOMA, COLON, HX OF 09/21/2008  . ANEMIA-IRON DEFICIENCY 05/23/2009  . ANGIODYSPLASIA-INTESTINE 05/23/2009  . CHEST PAIN, PLEURITIC 11/21/2008  . COPD 02/01/2010  . DEPRESSION 09/21/2008  . FECAL OCCULT BLOOD 11/08/2009  . GERD 09/21/2008  . HYPERTENSION 11/01/2008  . INSOMNIA 04/23/2010  . Memory loss 01/08/2009  . 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 SUBDURAL HEMATOMA 09/21/2008  . Pulmonary collapse 06/11/2009  . UNSPECIFIED BACTERIAL PNEUMONIA 02/14/2010  . Aorta aneurysm    Past Surgical History  Procedure Laterality Date  . Cholecystectomy    . Aortic valve replacement    . Partial colectomy      cancered removed    reports that he quit smoking about 25 years ago. His smoking use included Cigarettes. He smoked 2.50 packs per day. He does not have any smokeless tobacco history on file. He reports that he does not drink alcohol or use illicit drugs. family history includes Cancer in his father; Heart disease in his father; Prostate cancer in his brother. Allergies  Allergen Reactions  . Codeine Sulfate Other (See Comments)   Unknown      Review of Systems  Constitutional: Negative for fatigue.  Eyes: Negative for visual disturbance.  Respiratory: Negative for cough, chest tightness and shortness of breath.   Cardiovascular: Negative for chest pain, palpitations and leg swelling.  Neurological: Negative for dizziness, syncope, weakness, light-headedness and headaches.       Objective:   Physical Exam  Constitutional: He is oriented to person, place, and time. He appears well-developed and well-nourished.  HENT:  Right Ear: External ear normal.  Left Ear: External ear normal.  Mouth/Throat: Oropharynx is clear and moist.  Eyes: Pupils are equal, round, and reactive to light.  Neck: Neck supple. No thyromegaly present.  Cardiovascular: Normal rate and regular rhythm.   Pulmonary/Chest: Effort normal and breath sounds normal. No respiratory distress. He has no wheezes. He has no rales.  Musculoskeletal: He exhibits no edema.  Neurological: He is alert and oriented to person, place, and time.          Assessment & Plan:  #1 COPD. Patient refuses home oxygen but he probably is a candidate based on home oximetry levels. Refills for one year. Flu vaccine given. Pneumovax up-to-date #2 dementia. Stable. Excellent home support. Continue Aricept. #3 remote history of colon cancer. He is not a surgical candidate. We've recommended against any further CEA testing given the above  #4 GERD which is well controlled

## 2014-03-02 NOTE — Progress Notes (Signed)
Pre visit review using our clinic review tool, if applicable. No additional management support is needed unless otherwise documented below in the visit note. 

## 2014-04-01 ENCOUNTER — Other Ambulatory Visit: Payer: Self-pay | Admitting: Pulmonary Disease

## 2014-05-26 ENCOUNTER — Other Ambulatory Visit: Payer: Self-pay | Admitting: Family Medicine

## 2014-06-21 ENCOUNTER — Telehealth: Payer: Self-pay | Admitting: Family Medicine

## 2014-06-21 NOTE — Telephone Encounter (Signed)
Son called to see if he can get an order for a new 3 in 1 potty chair for pt. The one they have is rusted and old. [facility] and they advised that w/ his insurnace it would probably be paid for. Do you know if we can send order to [facility] and they deliver?

## 2014-06-21 NOTE — Telephone Encounter (Signed)
OK 

## 2014-06-21 NOTE — Telephone Encounter (Signed)
Please advise if ok for order?  

## 2014-06-21 NOTE — Telephone Encounter (Signed)
Order faxed to [facility]. 

## 2014-08-20 ENCOUNTER — Other Ambulatory Visit: Payer: Self-pay | Admitting: Family Medicine

## 2014-08-24 ENCOUNTER — Other Ambulatory Visit: Payer: Self-pay | Admitting: *Deleted

## 2014-08-24 MED ORDER — ALBUTEROL SULFATE (2.5 MG/3ML) 0.083% IN NEBU: INHALATION_SOLUTION | RESPIRATORY_TRACT | Status: DC

## 2014-09-11 ENCOUNTER — Other Ambulatory Visit: Payer: Self-pay | Admitting: Family Medicine

## 2014-09-16 ENCOUNTER — Other Ambulatory Visit: Payer: Self-pay | Admitting: Family Medicine

## 2014-09-25 ENCOUNTER — Other Ambulatory Visit: Payer: Self-pay

## 2014-09-25 MED ORDER — ALBUTEROL SULFATE (2.5 MG/3ML) 0.083% IN NEBU: INHALATION_SOLUTION | RESPIRATORY_TRACT | Status: DC

## 2014-09-27 ENCOUNTER — Telehealth: Payer: Self-pay | Admitting: Family Medicine

## 2014-09-27 NOTE — Telephone Encounter (Signed)
Loring Liskey called with concerns about his dad, Gerald Dyer. He was wondering when are they suppose to know when to get a nurse to come out once a week and would that be an option for Barnabas Lister? Christopher Glasscock work number [phone].

## 2014-09-27 NOTE — Telephone Encounter (Signed)
I feel certain he would qualify.  Why dont we see him back since there has been a change of his behavior.

## 2014-09-27 NOTE — Telephone Encounter (Signed)
Pt son states that patient does have a caregiver there with him. Pt has a change in appetite, sleeping most of the day, pt seems depressed, patient can walk to bedroom to chair and can walk to the bathroom.

## 2014-09-28 NOTE — Telephone Encounter (Signed)
Pt son Barbaraann Rondo is informed. Will callback for appt.

## 2014-10-16 ENCOUNTER — Encounter: Payer: Self-pay | Admitting: Internal Medicine

## 2015-02-10 ENCOUNTER — Other Ambulatory Visit: Payer: Self-pay | Admitting: Family Medicine

## 2015-03-16 ENCOUNTER — Other Ambulatory Visit: Payer: Self-pay | Admitting: Family Medicine

## 2015-04-13 ENCOUNTER — Other Ambulatory Visit: Payer: Self-pay | Admitting: Family Medicine

## 2015-05-18 ENCOUNTER — Other Ambulatory Visit: Payer: Self-pay | Admitting: Family Medicine

## 2015-06-17 ENCOUNTER — Encounter (HOSPITAL_COMMUNITY): Payer: Self-pay

## 2015-06-17 ENCOUNTER — Emergency Department (HOSPITAL_COMMUNITY)
Admission: EM | Admit: 2015-06-17 | Discharge: 2015-06-18 | Disposition: A | Discharge status: Home / Self Care | Payer: Medicare Other | Attending: Emergency Medicine | Admitting: Emergency Medicine

## 2015-06-17 ENCOUNTER — Emergency Department (HOSPITAL_COMMUNITY): Payer: Medicare Other

## 2015-06-17 DIAGNOSIS — Z7982 Long term (current) use of aspirin: Secondary | ICD-10-CM | POA: Diagnosis not present

## 2015-06-17 DIAGNOSIS — Y92000 Kitchen of unspecified non-institutional (private) residence as  the place of occurrence of the external cause: Secondary | ICD-10-CM | POA: Diagnosis not present

## 2015-06-17 DIAGNOSIS — Z79899 Other long term (current) drug therapy: Secondary | ICD-10-CM | POA: Insufficient documentation

## 2015-06-17 DIAGNOSIS — Z87891 Personal history of nicotine dependence: Secondary | ICD-10-CM | POA: Insufficient documentation

## 2015-06-17 DIAGNOSIS — D649 Anemia, unspecified: Secondary | ICD-10-CM | POA: Diagnosis not present

## 2015-06-17 DIAGNOSIS — F0390 Unspecified dementia without behavioral disturbance: Secondary | ICD-10-CM | POA: Insufficient documentation

## 2015-06-17 DIAGNOSIS — Z85038 Personal history of other malignant neoplasm of large intestine: Secondary | ICD-10-CM | POA: Diagnosis not present

## 2015-06-17 DIAGNOSIS — I10 Essential (primary) hypertension: Secondary | ICD-10-CM | POA: Insufficient documentation

## 2015-06-17 DIAGNOSIS — J159 Unspecified bacterial pneumonia: Secondary | ICD-10-CM | POA: Insufficient documentation

## 2015-06-17 DIAGNOSIS — R410 Disorientation, unspecified: Secondary | ICD-10-CM | POA: Diagnosis not present

## 2015-06-17 DIAGNOSIS — Z952 Presence of prosthetic heart valve: Secondary | ICD-10-CM | POA: Insufficient documentation

## 2015-06-17 DIAGNOSIS — W19XXXA Unspecified fall, initial encounter: Secondary | ICD-10-CM | POA: Diagnosis not present

## 2015-06-17 DIAGNOSIS — Z043 Encounter for examination and observation following other accident: Secondary | ICD-10-CM | POA: Insufficient documentation

## 2015-06-17 DIAGNOSIS — K219 Gastro-esophageal reflux disease without esophagitis: Secondary | ICD-10-CM | POA: Diagnosis not present

## 2015-06-17 DIAGNOSIS — R05 Cough: Secondary | ICD-10-CM | POA: Diagnosis present

## 2015-06-17 DIAGNOSIS — Y999 Unspecified external cause status: Secondary | ICD-10-CM | POA: Diagnosis not present

## 2015-06-17 DIAGNOSIS — I509 Heart failure, unspecified: Secondary | ICD-10-CM | POA: Insufficient documentation

## 2015-06-17 DIAGNOSIS — J189 Pneumonia, unspecified organism: Secondary | ICD-10-CM

## 2015-06-17 DIAGNOSIS — G4751 Confusional arousals: Secondary | ICD-10-CM | POA: Diagnosis not present

## 2015-06-17 DIAGNOSIS — F329 Major depressive disorder, single episode, unspecified: Secondary | ICD-10-CM | POA: Diagnosis not present

## 2015-06-17 DIAGNOSIS — J441 Chronic obstructive pulmonary disease with (acute) exacerbation: Secondary | ICD-10-CM | POA: Insufficient documentation

## 2015-06-17 LAB — URINALYSIS, ROUTINE W REFLEX MICROSCOPIC
BILIRUBIN URINE: NEGATIVE
GLUCOSE, UA: NEGATIVE mg/dL
HGB URINE DIPSTICK: NEGATIVE
Ketones, ur: NEGATIVE mg/dL
Leukocytes, UA: NEGATIVE
Nitrite: NEGATIVE
PROTEIN: NEGATIVE mg/dL
Specific Gravity, Urine: 1.019 (ref 1.005–1.030)
pH: 5.5 (ref 5.0–8.0)

## 2015-06-17 LAB — COMPREHENSIVE METABOLIC PANEL
ALBUMIN: 3.6 g/dL (ref 3.5–5.0)
ALK PHOS: 91 U/L (ref 38–126)
ALT: 18 U/L (ref 17–63)
AST: 25 U/L (ref 15–41)
Anion gap: 8 (ref 5–15)
BILIRUBIN TOTAL: 0.9 mg/dL (ref 0.3–1.2)
BUN: 17 mg/dL (ref 6–20)
CALCIUM: 9.1 mg/dL (ref 8.9–10.3)
CO2: 25 mmol/L (ref 22–32)
Chloride: 106 mmol/L (ref 101–111)
Creatinine, Ser: 1.39 mg/dL — ABNORMAL HIGH (ref 0.61–1.24)
GFR calc Af Amer: 51 mL/min — ABNORMAL LOW (ref >60)
GFR calc non Af Amer: 44 mL/min — ABNORMAL LOW (ref >60)
GLUCOSE: 130 mg/dL — AB (ref 65–99)
POTASSIUM: 4.3 mmol/L (ref 3.5–5.1)
Sodium: 139 mmol/L (ref 135–145)
Total Protein: 6.7 g/dL (ref 6.5–8.1)

## 2015-06-17 LAB — I-STAT TROPONIN, ED: Troponin i, poc: 0.02 ng/mL (ref 0.00–0.08)

## 2015-06-17 LAB — I-STAT CG4 LACTIC ACID, ED: Lactic Acid, Venous: 1.09 mmol/L (ref 0.5–2.0)

## 2015-06-17 MED ORDER — CEFTRIAXONE SODIUM 1 G IJ SOLR
1.0000 g | Freq: Once | INTRAMUSCULAR | Status: AC
  Administered 2015-06-17: 1 g via INTRAVENOUS
  Filled 2015-06-17: qty 10

## 2015-06-17 MED ORDER — LEVOFLOXACIN 750 MG PO TABS: 750.0000 mg | ORAL_TABLET | Freq: Every day | ORAL | Status: AC

## 2015-06-17 NOTE — Discharge Instructions (Signed)
Community-Acquired Pneumonia, Adult Pneumonia is an infection of the lungs. One type of pneumonia can happen while a person is in a hospital. A different type can happen when a person is not in a hospital (community-acquired pneumonia). It is easy for this kind to spread from person to person. It can spread to you if you breathe near an infected person who coughs or sneezes. Some symptoms include:  A dry cough.  A wet (productive) cough.  Fever.  Sweating.  Chest pain. HOME CARE  Take over-the-counter and prescription medicines only as told by your doctor.  Only take cough medicine if you are losing sleep.  If you were prescribed an antibiotic medicine, take it as told by your doctor. Do not stop taking the antibiotic even if you start to feel better.  Sleep with your head and neck raised (elevated). You can do this by putting a few pillows under your head, or you can sleep in a recliner.  Do not use tobacco products. These include cigarettes, chewing tobacco, and e-cigarettes. If you need help quitting, ask your doctor.  Drink enough water to keep your pee (urine) clear or pale yellow. A shot (vaccine) can help prevent pneumonia. Shots are often suggested for:  People older than 79 years of age.  People older than 79 years of age:  Who are having cancer treatment.  Who have long-term (chronic) lung disease.  Who have problems with their body's defense system (immune system). You may also prevent pneumonia if you take these actions:  Get the flu (influenza) shot every year.  Go to the dentist as often as told.  Wash your hands often. If soap and water are not available, use hand sanitizer. GET HELP IF:  You have a fever.  You lose sleep because your cough medicine does not help. GET HELP RIGHT AWAY IF:  You are short of breath and it gets worse.  You have more chest pain.  Your sickness gets worse. This is very serious if:  You are an older adult.  Your  body's defense system is weak.  You cough up blood.   This information is not intended to replace advice given to you by your health care provider. Make sure you discuss any questions you have with your health care provider.   Document Released: 11/26/2007 Document Revised: 02/28/2015 Document Reviewed: 10/04/2014 Elsevier Interactive Patient Education 2016 Heath in Hospitals, Adult As a hospital patient, your condition and the treatments you receive can increase your risk for falls. Some additional risk factors for falls in a hospital include:  Being in an unfamiliar environment.  Being on bed rest.  Your surgery.  Taking certain medicines.  Your tubing requirements, such as intravenous (IV) therapy or catheters. It is important that you learn how to decrease fall risks while at the hospital. Below are important tips that can help prevent falls. SAFETY TIPS FOR PREVENTING FALLS Talk about your risk of falling.  Ask your health care provider why you are at risk for falling. Is it your medicine, illness, tubing placement, or something else?  Make a plan with your health care provider to keep you safe from falls.  Ask your health care provider or pharmacist about side effects of your medicines. Some medicines can make you dizzy or affect your coordination. Ask for help.  Ask for help before getting out of bed. You may need to press your call button.  Ask for assistance in getting safely to the toilet.  Ask for a walker or cane to be put at your bedside. Ask that most of the side rails on your bed be placed up before your health care provider leaves the room.  Ask family or friends to sit with you.  Ask for things that are out of your reach, such as your glasses, hearing aids, telephone, bedside table, or call button. Follow these tips to avoid falling:  Stay lying or seated, rather than standing, while waiting for help.  Wear rubber-soled slippers  or shoes whenever you walk in the hospital.  Avoid quick, sudden movements.  Change positions slowly.  Sit on the side of your bed before standing.  Stand up slowly and wait before you start to walk.  Let your health care provider know if there is a spill on the floor.  Pay careful attention to the medical equipment, electrical cords, and tubes around you.  When you need help, use your call button by your bed or in the bathroom. Wait for one of your health care providers to help you.  If you feel dizzy or unsure of your footing, return to bed and wait for assistance.  Avoid being distracted by the TV, telephone, or another person in your room.  Do not lean or support yourself on rolling objects, such as IV poles or bedside tables.   This information is not intended to replace advice given to you by your health care provider. Make sure you discuss any questions you have with your health care provider.   Document Released: 06/06/2000 Document Revised: 06/30/2014 Document Reviewed: 02/15/2012 Elsevier Interactive Patient Education [employer].

## 2015-06-17 NOTE — ED Notes (Signed)
Per EMS: Pt at home with family. Son stepped away for minute, pt fell. Unreliable history from pt, unsure if LOC. Pt denies any pain. Hx of dementia, COPD. Unable to state time/place. Strong urine odor upon pt arrival. Sats 85% on RA. 92% on 4L O2.

## 2015-06-17 NOTE — ED Provider Notes (Signed)
CSN: [id number]     Arrival date & time 06/17/15  2105 History   First MD Initiated Contact with Patient 06/17/15 2131     Chief Complaint  Patient presents with  . Fall  . Altered Mental Status     (Consider location/radiation/quality/duration/timing/severity/associated sxs/prior Treatment) Patient is a 81 y.o. male presenting with fall. The history is provided by the patient and a relative.  Fall This is a new problem. The current episode started today. The problem occurs constantly. The problem has been unchanged. Associated symptoms include coughing and fatigue. Pertinent negatives include no abdominal pain, anorexia, arthralgias, change in bowel habit, chest pain, chills, congestion, diaphoresis, fever, headaches, joint swelling, myalgias, nausea, neck pain, numbness, rash, sore throat, urinary symptoms or vomiting. Associated symptoms comments: Shortness of breath . Nothing aggravates the symptoms. He has tried nothing for the symptoms.    Past Medical History  Diagnosis Date  . ADENOCARCINOMA, COLON, HX OF 09/21/2008  . ANEMIA-IRON DEFICIENCY 05/23/2009  . ANGIODYSPLASIA-INTESTINE 05/23/2009  . CHEST PAIN, PLEURITIC 11/21/2008  . COPD 02/01/2010  . DEPRESSION 09/21/2008  . FECAL OCCULT BLOOD 11/08/2009  . GERD 09/21/2008  . HYPERTENSION 11/01/2008  . INSOMNIA 04/23/2010  . Memory loss 01/08/2009  . 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 SUBDURAL HEMATOMA 09/21/2008  . Pulmonary collapse 06/11/2009  . UNSPECIFIED BACTERIAL PNEUMONIA 02/14/2010  . Aorta aneurysm [facility])    Past Surgical History  Procedure Laterality Date  . Cholecystectomy    . Aortic valve replacement    . Partial colectomy      cancered removed   Family History  Problem Relation Age of Onset  . Heart disease Father   . Prostate cancer Brother   . Cancer Father     kind unknown   Social History  Substance Use Topics  . Smoking status: Former Smoker -- 2.50 packs/day    Types: Cigarettes    Quit date:  06/23/1988  . Smokeless tobacco: None  . Alcohol Use: No    Review of Systems  Constitutional: Positive for fatigue. Negative for fever, chills and diaphoresis.  HENT: Negative for congestion and sore throat.   Eyes: Negative for pain.  Respiratory: Positive for cough and shortness of breath.   Cardiovascular: Negative for chest pain.  Gastrointestinal: Negative for nausea, vomiting, abdominal pain, diarrhea, anorexia and change in bowel habit.  Genitourinary: Negative for dysuria.  Musculoskeletal: Negative for myalgias, joint swelling, arthralgias and neck pain.  Skin: Negative for rash.  Neurological: Negative for numbness and headaches.  Psychiatric/Behavioral: Positive for confusion.      Allergies  Codeine sulfate  Home Medications   Prior to Admission medications   Medication Sig Start Date End Date Taking? Authorizing Provider  albuterol (PROVENTIL) (2.5 MG/3ML) 0.083% nebulizer solution USE 1 VIAL IN NEBULIZER 4 TIMES DAILY FOR SHORTNESS OF BREATH J44.9 09/25/14  Yes Eulas Post, MD  aspirin 81 MG tablet Take 81 mg by mouth daily.     Yes Historical Provider, MD  donepezil (ARICEPT) 10 MG tablet TAKE 1 TABLET BY MOUTH DAILY 05/22/15  Yes Eulas Post, MD  ferrous sulfate 325 (65 FE) MG tablet Take 325 mg by mouth daily with breakfast.    Yes Historical Provider, MD  Multiple Vitamin (MULTIVITAMIN) tablet Take 1 tablet by mouth daily.    Yes Historical Provider, MD  omeprazole (PRILOSEC) 20 MG capsule TAKE ONE CAPSULE BY MOUTH EVERY DAY 09/18/14  Yes Eulas Post, MD  sertraline (ZOLOFT) 100 MG tablet TAKE 1 TABLET  BY MOUTH EVERY DAY 09/18/14  Yes Eulas Post, MD  SPIRIVA HANDIHALER 18 MCG inhalation capsule PLACE 1 CAPSULE INTO INHALER AND INHALE ONCE DAILY 05/22/15  Yes Eulas Post, MD  levofloxacin (LEVAQUIN) 750 MG tablet Take 1 tablet (750 mg total) by mouth daily. 06/17/15 06/22/15  Esaw Grandchild, MD   BP 130/74 mmHg  Pulse 89  Temp(Src)  98.4 F (36.9 C) (Oral)  Resp 23  SpO2 96% Physical Exam  HENT:  Head: Head is without abrasion, without contusion and without laceration.  Mouth/Throat: Mucous membranes are dry.  Eyes: Conjunctivae and EOM are normal. Pupils are equal, round, and reactive to light.  Neck: Normal range of motion. No erythema and normal range of motion present.  Cardiovascular: Normal rate and regular rhythm.  Exam reveals no decreased pulses.   No murmur heard. Pulmonary/Chest: Effort normal. No tachypnea and no bradypnea. He has no wheezes. He has rhonchi in the right lower field and the left lower field.  Abdominal: Normal appearance. There is no tenderness. There is no rigidity, no guarding, no tenderness at McBurney's point and negative Murphy's sign.  Neurological: He is alert. He is disoriented. No cranial nerve deficit or sensory deficit. GCS eye subscore is 4. GCS verbal subscore is 5. GCS motor subscore is 6.  Pt not oriented to day, week, month, year.  But oriented to place and self  Skin: No abrasion and no rash noted.  Psychiatric: He has a normal mood and affect. His speech is normal and behavior is normal. Cognition and memory are impaired.    ED Course  Procedures (including critical care time) Labs Review Labs Reviewed  COMPREHENSIVE METABOLIC PANEL - Abnormal; Notable for the following:    Glucose, Bld 130 (*)    Creatinine, Ser 1.39 (*)    GFR calc non Af Amer 44 (*)    GFR calc Af Amer 51 (*)    All other components within normal limits  URINALYSIS, ROUTINE W REFLEX MICROSCOPIC (NOT AT [facility])  CBC WITH DIFFERENTIAL/PLATELET  I-STAT CG4 LACTIC ACID, ED  I-STAT TROPOININ, ED  I-STAT CG4 LACTIC ACID, ED  Randolm Idol, ED    Imaging Review Dg Chest 2 View  06/17/2015  CLINICAL DATA:  Acute onset of cough and decreased O2 saturation. Initial encounter. EXAM: CHEST  2 VIEW COMPARISON:  Chest radiograph performed 04/28/2012 FINDINGS: The lungs are well-aerated. Vascular  congestion is noted. Increased interstitial markings and left basilar opacity may reflect mild interstitial edema, or possibly pneumonia. There is no evidence of pleural effusion or pneumothorax. The heart is mildly enlarged. The patient is status post median sternotomy. No acute osseous abnormalities are seen. IMPRESSION: Vascular congestion and mild cardiomegaly. Increased interstitial markings and left basilar airspace opacity may reflect mild interstitial edema, or possibly pneumonia. Electronically Signed   By: Garald Balding M.D.   On: 06/17/2015 22:02   Ct Head Wo Contrast  06/17/2015  CLINICAL DATA:  History dementia.  Fall. EXAM: CT HEAD WITHOUT CONTRAST CT CERVICAL SPINE WITHOUT CONTRAST TECHNIQUE: Multidetector CT imaging of the head and cervical spine was performed following the standard protocol without intravenous contrast. Multiplanar CT image reconstructions of the cervical spine were also generated. COMPARISON:  Head CT 11/29/2009 FINDINGS: CT HEAD FINDINGS No acute intracranial hemorrhage. No focal mass lesion. No CT evidence of acute infarction. No midline shift or mass effect. No hydrocephalus. No intracranial hemorrhage. No parenchymal contusion. No midline shift or mass effect. Basilar cisterns are patent. No skull base fracture.  No fluid in the paranasal sinuses or mastoid air cells. Orbits are normal. There is generalized cortical atrophy and proportional ventricular dilatation not changed from prior. Extensive periventricular subcortical white matter hypodensities. Craniotomy flap over the RIGHT calvarium. CT CERVICAL SPINE FINDINGS Normal alignment of the cervical vertebral bodies. There is multiple levels of disc space narrowing. There is extensive osteoporosis the spine. Normal facet articulation. Normal craniocervical junction. No evidence epidural paraspinal hematoma. Limited view of the lung apices demonstrates chronic interstitial lung disease. There is some superimposed  interstitial thickening and nodularity which is increased from comparison CT 2011. IMPRESSION: 1. No acute intracranial findings. 2. Severe cortical atrophy advanced compared to prior. 3. No cervical spine fracture. 4. Multilevel disc osteophytic disease and osteoporosis. 5. Interstitial lung disease at the lung apices with potential superimposed edema or infection. Electronically Signed   By: Suzy Bouchard M.D.   On: 06/17/2015 22:41   Ct Cervical Spine Wo Contrast  06/17/2015  CLINICAL DATA:  History dementia.  Fall. EXAM: CT HEAD WITHOUT CONTRAST CT CERVICAL SPINE WITHOUT CONTRAST TECHNIQUE: Multidetector CT imaging of the head and cervical spine was performed following the standard protocol without intravenous contrast. Multiplanar CT image reconstructions of the cervical spine were also generated. COMPARISON:  Head CT 11/29/2009 FINDINGS: CT HEAD FINDINGS No acute intracranial hemorrhage. No focal mass lesion. No CT evidence of acute infarction. No midline shift or mass effect. No hydrocephalus. No intracranial hemorrhage. No parenchymal contusion. No midline shift or mass effect. Basilar cisterns are patent. No skull base fracture. No fluid in the paranasal sinuses or mastoid air cells. Orbits are normal. There is generalized cortical atrophy and proportional ventricular dilatation not changed from prior. Extensive periventricular subcortical white matter hypodensities. Craniotomy flap over the RIGHT calvarium. CT CERVICAL SPINE FINDINGS Normal alignment of the cervical vertebral bodies. There is multiple levels of disc space narrowing. There is extensive osteoporosis the spine. Normal facet articulation. Normal craniocervical junction. No evidence epidural paraspinal hematoma. Limited view of the lung apices demonstrates chronic interstitial lung disease. There is some superimposed interstitial thickening and nodularity which is increased from comparison CT 2011. IMPRESSION: 1. No acute intracranial  findings. 2. Severe cortical atrophy advanced compared to prior. 3. No cervical spine fracture. 4. Multilevel disc osteophytic disease and osteoporosis. 5. Interstitial lung disease at the lung apices with potential superimposed edema or infection. Electronically Signed   By: Suzy Bouchard M.D.   On: 06/17/2015 22:41   I have personally reviewed and evaluated these images and lab results as part of my medical decision-making.   EKG Interpretation None      MDM   Final diagnoses:  Fall, initial encounter  Community acquired pneumonia   81 year old Caucasian male with past medical history of COPD, dementia, CHF, aortic aneurysm presents in setting of possible fall and possible altered mental status. Per son as patient is poor historian family was with patient today and left approximately 20 minutes prior to being called by EMS. Patient had reportedly fallen in the kitchen and used life alert monitor. On arrival of EMS patient was found down and kitchen and had altered mental status and was brought in and [place] for further evaluation in the emergency department. Patient found to have oxygen saturations in the 80s on room air and bilateral lower lobe rhonchi. Patient with no obvious trauma on examination. On arrival of children they confirm the patient is at baseline mental status with his dementia. The report patient lives at home but has caregivers who were  there during daytime hours. They report patient had cough and complained of shortness of breath at family get-together earlier. Patient has no complaints at this time. He was told in past he needs to wear oxygen at all times the patient refuses to wear oxygen at home.  In setting of possible fall we'll obtain CT head and C-spine. Additionally obtain chest x-ray and laboratory analysis in setting of cough and shortness of breath. Patient denies any chest pain at this time.  Patient remained comfortable on reevaluation and CT head and C-spine did  not reveal any acute fracture or malalignment. Patient able to move all 4 extremity is without complications. Patient was not alert to day mother time but was oriented to self and place. Patient neurovascularly intact on examination. No signs of UTI on urinalysis. No significant electrolyte abnormalities. EKG unchanged from previous. Chest x-ray consistent with pneumonia. Fall and possible change in mental status likely secondary to pneumonia. Patient given IV Rocephin and will be discharged home on Levaquin in setting of community-acquired pneumonia. Family did not want patient admitted to the hospital at this time and wanted to attempt outpatient management. They were given strict return precautions and advised to follow up with PCP in one day for reevaluation. Patient continues to refuse home oxygen. Patient stable at time of discharge and family in agreement with plan.  Attending has seen and evaluated patient and Dr. Roxanne Mins is in agreement with plan.    Esaw Grandchild, MD [id number]  Delora Fuel, MD [phone]

## 2015-06-21 ENCOUNTER — Ambulatory Visit (INDEPENDENT_AMBULATORY_CARE_PROVIDER_SITE_OTHER): Payer: Medicare Other | Admitting: Family Medicine

## 2015-06-21 ENCOUNTER — Encounter: Payer: Self-pay | Admitting: Family Medicine

## 2015-06-21 VITALS — BP 120/66 | HR 84 | Temp 97.5°F

## 2015-06-21 DIAGNOSIS — J189 Pneumonia, unspecified organism: Secondary | ICD-10-CM

## 2015-06-21 DIAGNOSIS — R413 Other amnesia: Secondary | ICD-10-CM | POA: Diagnosis not present

## 2015-06-21 DIAGNOSIS — R296 Repeated falls: Secondary | ICD-10-CM

## 2015-06-21 DIAGNOSIS — Z9181 History of falling: Secondary | ICD-10-CM

## 2015-06-21 DIAGNOSIS — Z23 Encounter for immunization: Secondary | ICD-10-CM

## 2015-06-21 NOTE — Progress Notes (Signed)
   Subjective:    Patient ID: Gerald Dyer, male    DOB: 06/07/1925, 81 y.o.   MRN: [id number]  HPI    Review of Systems     Objective:   Physical Exam        Assessment & Plan:

## 2015-06-21 NOTE — Patient Instructions (Signed)
Follow up for any recurrent fever or increased shortness of breath. 

## 2015-06-21 NOTE — Progress Notes (Signed)
Subjective:    Patient ID: Gerald Dyer, male    DOB: 1924-12-25, 81 y.o.   MRN: [id number]  HPI Patient seen for ER follow-up. He was evaluated on 06/17/2015 following a fall which was unwitnessed at home He has multiple chronic problems including history of COPD, advanced dementia, hypertension, GERD, remote history of subdural hematoma requiring evacuation, remote history of colon cancer, chronic kidney disease stage III, and history of recurrent depression.  Currently lives at home with excellent support from 2 sons. They currently have helper assisting him from 9 AM to-6 PM each day. On date above he apparently fell in his kitchen. He had some coughing and fatigue possibly some mild shortness of breath. Taken to ER.  No signs of obvious trauma. Labs were mostly unremarkable. Chest x-ray revealed some basilar airspace opacity possibly reflecting pneumonia. CT head no acute findings. CT cervical spine no fracture.  On evaluation family felt he was at baseline mental status. No signs of UTI. EKG no acute findings. Patient was given IV Rocephin for possible community acquired pneumonia and discharged on Levaquin. No fever or concerns since discharge. Rare cough. Ambulating with walker. Very high risk of falls. No flu vaccine yet.  Past Medical History  Diagnosis Date  . ADENOCARCINOMA, COLON, HX OF 09/21/2008  . ANEMIA-IRON DEFICIENCY 05/23/2009  . ANGIODYSPLASIA-INTESTINE 05/23/2009  . CHEST PAIN, PLEURITIC 11/21/2008  . COPD 02/01/2010  . DEPRESSION 09/21/2008  . FECAL OCCULT BLOOD 11/08/2009  . GERD 09/21/2008  . HYPERTENSION 11/01/2008  . INSOMNIA 04/23/2010  . Memory loss 01/08/2009  . 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 SUBDURAL HEMATOMA 09/21/2008  . Pulmonary collapse 06/11/2009  . UNSPECIFIED BACTERIAL PNEUMONIA 02/14/2010  . Aorta aneurysm [facility])    Past Surgical History  Procedure Laterality Date  . Cholecystectomy    . Aortic valve replacement    . Partial colectomy     cancered removed    reports that he quit smoking about 27 years ago. His smoking use included Cigarettes. He smoked 2.50 packs per day. He does not have any smokeless tobacco history on file. He reports that he does not drink alcohol or use illicit drugs. family history includes Cancer in his father; Heart disease in his father; Prostate cancer in his brother. Allergies  Allergen Reactions  . Codeine Sulfate Other (See Comments)    Unknown      Review of Systems  Constitutional: Negative for fever and chills.  Respiratory: Negative for cough and shortness of breath.   Cardiovascular: Negative for chest pain.  Gastrointestinal: Negative for nausea, vomiting and diarrhea.  Genitourinary: Negative for dysuria.  Neurological: Negative for dizziness.  Psychiatric/Behavioral: Negative for agitation.       Objective:   Physical Exam  Constitutional: He appears well-developed and well-nourished.  Alert pleasant and demented elderly gentleman  HENT:  Head: Normocephalic and atraumatic.  Neck: Neck supple.  Cardiovascular: Normal rate and regular rhythm.   Pulmonary/Chest: Effort normal.  He has some faint crackles in both bases. No wheezes. No respiratory distress  Musculoskeletal: He exhibits no edema.  Lymphadenopathy:    He has no cervical adenopathy.  Neurological: He is alert.          Assessment & Plan:  #1 possible recent community-acquired pneumonia. Symptomatically stable on Levaquin. We will  not to get follow-up x-ray with his age and advanced dementia   #2 recent fall and high risk for recurrent falls. We discussed home physical therapy and occupational therapy evaluation. He apparently has  also had some difficulties with transfers from chair to standing.  #3 social. Family interested in keeping him home as long as possible and explore getting a sitter at night.  #4 advanced dementia. We discussed possibly discontinuing Aricept in near future as his dementia  progresses

## 2015-06-21 NOTE — Progress Notes (Signed)
Pre visit review using our clinic review tool, if applicable. No additional management support is needed unless otherwise documented below in the visit note. 

## 2015-06-26 ENCOUNTER — Telehealth: Payer: Self-pay | Admitting: Family Medicine

## 2015-06-26 NOTE — Telephone Encounter (Signed)
OK to approve requested referrals.  Would take 65 mg iron.  Notify if consistent O2 sats < 88%.

## 2015-06-26 NOTE — Telephone Encounter (Signed)
Printed message and placed on your desk. Please review.

## 2015-06-26 NOTE — Telephone Encounter (Signed)
Medication UTD.

## 2015-06-26 NOTE — Telephone Encounter (Signed)
Gerald Dyer is aware of Dr. Anastasio Auerbach annotations. She will report persistent O2 below 88.

## 2015-06-26 NOTE — Telephone Encounter (Signed)
RN Sharyn Lull calling b/c patient needs VO for one a week for 2 week then twice a week for 6 weeks then once a week for two weeks working on balance, safety and mobility training. Also needs order for OT please. RN feels it is warranted. Would like a social work consult for [employer]. His O2 sats are running in the 80's and would like permission to check each visit as needed. RN wants to know if you have parameters on when you would like to be notified of O2 stats. Lastly his iron tablet is only 28mg  but order says he should be on 65mg  and she needs clarification on what dose he should be on.

## 2015-06-26 NOTE — Addendum Note (Signed)
Addended by: Elio Forget on: 06/26/2015 02:07 PM   Modules accepted: Medications

## 2015-06-28 ENCOUNTER — Telehealth: Payer: Self-pay | Admitting: Family Medicine

## 2015-06-28 NOTE — Telephone Encounter (Signed)
Sounds like he has had several readings < 88%.  I would go ahead and set up home O2 at 2 liters per minute-if he will be cooperative to wear.

## 2015-06-28 NOTE — Telephone Encounter (Signed)
Spoke with Dr. Elease Hashimoto and orders were faxed. Also spoke with son, Alvester Chou, and he is aware of O2 being sent to home.

## 2015-06-28 NOTE — Telephone Encounter (Signed)
Sharyn Lull with [facility] is aware of verbal orders and is equally concerned patient will not comply with orders of using nasal cannula. Will reach out now to get order set up to atleast attempt getting resources to patient.

## 2015-06-28 NOTE — Telephone Encounter (Signed)
Sharyn Lull is calling to let md know pt was seen yesterday and his oxygen was around 84 %.

## 2015-07-12 ENCOUNTER — Emergency Department (HOSPITAL_COMMUNITY): Payer: Medicare Other

## 2015-07-12 ENCOUNTER — Encounter (HOSPITAL_COMMUNITY): Payer: Self-pay

## 2015-07-12 ENCOUNTER — Inpatient Hospital Stay (HOSPITAL_COMMUNITY)
Admission: EM | Admit: 2015-07-12 | Discharge: 2015-07-17 | DRG: 056 | Disposition: A | Discharge status: Home Health | Payer: Medicare Other | Attending: Internal Medicine | Admitting: Internal Medicine

## 2015-07-12 ENCOUNTER — Telehealth: Payer: Self-pay | Admitting: Family Medicine

## 2015-07-12 DIAGNOSIS — G9340 Encephalopathy, unspecified: Secondary | ICD-10-CM | POA: Diagnosis present

## 2015-07-12 DIAGNOSIS — J449 Chronic obstructive pulmonary disease, unspecified: Secondary | ICD-10-CM

## 2015-07-12 DIAGNOSIS — Z888 Allergy status to other drugs, medicaments and biological substances status: Secondary | ICD-10-CM

## 2015-07-12 DIAGNOSIS — G8194 Hemiplegia, unspecified affecting left nondominant side: Secondary | ICD-10-CM | POA: Diagnosis not present

## 2015-07-12 DIAGNOSIS — N183 Chronic kidney disease, stage 3 unspecified: Secondary | ICD-10-CM | POA: Diagnosis present

## 2015-07-12 DIAGNOSIS — I129 Hypertensive chronic kidney disease with stage 1 through stage 4 chronic kidney disease, or unspecified chronic kidney disease: Secondary | ICD-10-CM | POA: Diagnosis present

## 2015-07-12 DIAGNOSIS — J440 Chronic obstructive pulmonary disease with acute lower respiratory infection: Secondary | ICD-10-CM | POA: Diagnosis present

## 2015-07-12 DIAGNOSIS — M6289 Other specified disorders of muscle: Secondary | ICD-10-CM | POA: Diagnosis not present

## 2015-07-12 DIAGNOSIS — I10 Essential (primary) hypertension: Secondary | ICD-10-CM | POA: Diagnosis present

## 2015-07-12 DIAGNOSIS — I4581 Long QT syndrome: Secondary | ICD-10-CM | POA: Diagnosis present

## 2015-07-12 DIAGNOSIS — Z87891 Personal history of nicotine dependence: Secondary | ICD-10-CM

## 2015-07-12 DIAGNOSIS — Z85038 Personal history of other malignant neoplasm of large intestine: Secondary | ICD-10-CM | POA: Diagnosis not present

## 2015-07-12 DIAGNOSIS — Z7982 Long term (current) use of aspirin: Secondary | ICD-10-CM | POA: Diagnosis not present

## 2015-07-12 DIAGNOSIS — J189 Pneumonia, unspecified organism: Secondary | ICD-10-CM | POA: Diagnosis present

## 2015-07-12 DIAGNOSIS — Z66 Do not resuscitate: Secondary | ICD-10-CM | POA: Diagnosis present

## 2015-07-12 DIAGNOSIS — J9601 Acute respiratory failure with hypoxia: Secondary | ICD-10-CM | POA: Diagnosis present

## 2015-07-12 DIAGNOSIS — I444 Left anterior fascicular block: Secondary | ICD-10-CM | POA: Diagnosis present

## 2015-07-12 DIAGNOSIS — Z952 Presence of prosthetic heart valve: Secondary | ICD-10-CM

## 2015-07-12 DIAGNOSIS — D696 Thrombocytopenia, unspecified: Secondary | ICD-10-CM | POA: Diagnosis present

## 2015-07-12 DIAGNOSIS — F329 Major depressive disorder, single episode, unspecified: Secondary | ICD-10-CM | POA: Diagnosis present

## 2015-07-12 DIAGNOSIS — D509 Iron deficiency anemia, unspecified: Secondary | ICD-10-CM | POA: Diagnosis present

## 2015-07-12 DIAGNOSIS — Z885 Allergy status to narcotic agent status: Secondary | ICD-10-CM | POA: Diagnosis not present

## 2015-07-12 DIAGNOSIS — F0390 Unspecified dementia without behavioral disturbance: Secondary | ICD-10-CM | POA: Diagnosis not present

## 2015-07-12 DIAGNOSIS — R29810 Facial weakness: Secondary | ICD-10-CM | POA: Diagnosis present

## 2015-07-12 DIAGNOSIS — R4182 Altered mental status, unspecified: Secondary | ICD-10-CM | POA: Diagnosis present

## 2015-07-12 DIAGNOSIS — J441 Chronic obstructive pulmonary disease with (acute) exacerbation: Secondary | ICD-10-CM | POA: Diagnosis present

## 2015-07-12 DIAGNOSIS — R531 Weakness: Secondary | ICD-10-CM | POA: Diagnosis present

## 2015-07-12 DIAGNOSIS — Z79899 Other long term (current) drug therapy: Secondary | ICD-10-CM

## 2015-07-12 DIAGNOSIS — J4489 Other specified chronic obstructive pulmonary disease: Secondary | ICD-10-CM | POA: Diagnosis present

## 2015-07-12 LAB — COMPREHENSIVE METABOLIC PANEL
ALBUMIN: 3.6 g/dL (ref 3.5–5.0)
ALK PHOS: 84 U/L (ref 38–126)
ALT: 19 U/L (ref 17–63)
ANION GAP: 10 (ref 5–15)
AST: 26 U/L (ref 15–41)
BILIRUBIN TOTAL: 0.6 mg/dL (ref 0.3–1.2)
BUN: 11 mg/dL (ref 6–20)
CALCIUM: 9.2 mg/dL (ref 8.9–10.3)
CO2: 22 mmol/L (ref 22–32)
CREATININE: 1.09 mg/dL (ref 0.61–1.24)
Chloride: 108 mmol/L (ref 101–111)
GFR calc Af Amer: >60 mL/min (ref >60)
GFR calc non Af Amer: 59 mL/min — ABNORMAL LOW (ref >60)
GLUCOSE: 100 mg/dL — AB (ref 65–99)
Potassium: 4.4 mmol/L (ref 3.5–5.1)
SODIUM: 140 mmol/L (ref 135–145)
TOTAL PROTEIN: 6.3 g/dL — AB (ref 6.5–8.1)

## 2015-07-12 LAB — CBC
HEMATOCRIT: 50.4 % (ref 39.0–52.0)
HEMOGLOBIN: 16.7 g/dL (ref 13.0–17.0)
MCH: 30.1 pg (ref 26.0–34.0)
MCHC: 33.1 g/dL (ref 30.0–36.0)
MCV: 90.8 fL (ref 78.0–100.0)
Platelets: 120 10*3/uL — ABNORMAL LOW (ref 150–400)
RBC: 5.55 MIL/uL (ref 4.22–5.81)
RDW: 15.2 % (ref 11.5–15.5)
WBC: 8.3 10*3/uL (ref 4.0–10.5)

## 2015-07-12 LAB — URINALYSIS, ROUTINE W REFLEX MICROSCOPIC
BILIRUBIN URINE: NEGATIVE
Glucose, UA: NEGATIVE mg/dL
Hgb urine dipstick: NEGATIVE
KETONES UR: NEGATIVE mg/dL
LEUKOCYTES UA: NEGATIVE
NITRITE: NEGATIVE
PH: 5.5 (ref 5.0–8.0)
PROTEIN: NEGATIVE mg/dL
Specific Gravity, Urine: 1.011 (ref 1.005–1.030)

## 2015-07-12 LAB — CBC WITH DIFFERENTIAL/PLATELET
BASOS ABS: 0 10*3/uL (ref 0.0–0.1)
BASOS PCT: 0 %
EOS ABS: 0.2 10*3/uL (ref 0.0–0.7)
EOS PCT: 2 %
HCT: 50.4 % (ref 39.0–52.0)
Hemoglobin: 16.9 g/dL (ref 13.0–17.0)
Lymphocytes Relative: 21 %
Lymphs Abs: 1.6 10*3/uL (ref 0.7–4.0)
MCH: 30.4 pg (ref 26.0–34.0)
MCHC: 33.5 g/dL (ref 30.0–36.0)
MCV: 90.6 fL (ref 78.0–100.0)
MONO ABS: 0.3 10*3/uL (ref 0.1–1.0)
MONOS PCT: 4 %
Neutro Abs: 5.5 10*3/uL (ref 1.7–7.7)
Neutrophils Relative %: 73 %
PLATELETS: 127 10*3/uL — AB (ref 150–400)
RBC: 5.56 MIL/uL (ref 4.22–5.81)
RDW: 15.4 % (ref 11.5–15.5)
WBC: 7.6 10*3/uL (ref 4.0–10.5)

## 2015-07-12 LAB — I-STAT ARTERIAL BLOOD GAS, ED
ACID-BASE DEFICIT: 2 mmol/L (ref 0.0–2.0)
BICARBONATE: 23.3 meq/L (ref 20.0–24.0)
O2 Saturation: 91 %
PH ART: 7.368 (ref 7.350–7.450)
TCO2: 25 mmol/L (ref 0–100)
pCO2 arterial: 40.6 mmHg (ref 35.0–45.0)
pO2, Arterial: 63 mmHg — ABNORMAL LOW (ref 80.0–100.0)

## 2015-07-12 LAB — GLUCOSE, CAPILLARY: Glucose-Capillary: 124 mg/dL — ABNORMAL HIGH (ref 65–99)

## 2015-07-12 LAB — BRAIN NATRIURETIC PEPTIDE: B Natriuretic Peptide: 172.9 pg/mL — ABNORMAL HIGH (ref 0.0–100.0)

## 2015-07-12 LAB — TROPONIN I

## 2015-07-12 MED ORDER — ASPIRIN EC 81 MG PO TBEC
81.0000 mg | DELAYED_RELEASE_TABLET | Freq: Every day | ORAL | Status: DC
  Administered 2015-07-13: 81 mg via ORAL
  Filled 2015-07-12: qty 1

## 2015-07-12 MED ORDER — ENOXAPARIN SODIUM 40 MG/0.4ML ~~LOC~~ SOLN
40.0000 mg | SUBCUTANEOUS | Status: DC
  Administered 2015-07-12 – 2015-07-16 (×5): 40 mg via SUBCUTANEOUS
  Filled 2015-07-12 (×5): qty 0.4

## 2015-07-12 MED ORDER — ALBUTEROL SULFATE (2.5 MG/3ML) 0.083% IN NEBU
2.5000 mg | INHALATION_SOLUTION | Freq: Four times a day (QID) | RESPIRATORY_TRACT | Status: DC
  Administered 2015-07-12 – 2015-07-13 (×3): 2.5 mg via RESPIRATORY_TRACT
  Filled 2015-07-12 (×3): qty 3

## 2015-07-12 MED ORDER — TIOTROPIUM BROMIDE MONOHYDRATE 18 MCG IN CAPS
18.0000 ug | ORAL_CAPSULE | Freq: Every day | RESPIRATORY_TRACT | Status: DC
  Administered 2015-07-13: 18 ug via RESPIRATORY_TRACT
  Filled 2015-07-12: qty 5

## 2015-07-12 MED ORDER — PANTOPRAZOLE SODIUM 40 MG PO TBEC
40.0000 mg | DELAYED_RELEASE_TABLET | Freq: Every day | ORAL | Status: DC
  Administered 2015-07-12 – 2015-07-17 (×6): 40 mg via ORAL
  Filled 2015-07-12 (×6): qty 1

## 2015-07-12 MED ORDER — DONEPEZIL HCL 10 MG PO TABS
10.0000 mg | ORAL_TABLET | Freq: Every day | ORAL | Status: DC
  Administered 2015-07-12 – 2015-07-17 (×6): 10 mg via ORAL
  Filled 2015-07-12 (×7): qty 1

## 2015-07-12 MED ORDER — ALBUTEROL SULFATE (2.5 MG/3ML) 0.083% IN NEBU: 2.5000 mg | INHALATION_SOLUTION | RESPIRATORY_TRACT | Status: DC | PRN

## 2015-07-12 MED ORDER — DEXTROSE 5 % IV SOLN: 1.0000 g | Freq: Once | INTRAVENOUS | Status: DC

## 2015-07-12 MED ORDER — DEXTROSE 5 % IV SOLN: 500.0000 mg | Freq: Once | INTRAVENOUS | Status: DC

## 2015-07-12 MED ORDER — SERTRALINE HCL 100 MG PO TABS
100.0000 mg | ORAL_TABLET | Freq: Every day | ORAL | Status: DC
  Administered 2015-07-12 – 2015-07-17 (×6): 100 mg via ORAL
  Filled 2015-07-12 (×6): qty 1

## 2015-07-12 MED ORDER — FERROUS GLUCONATE 324 (38 FE) MG PO TABS
324.0000 mg | ORAL_TABLET | Freq: Every day | ORAL | Status: DC
  Administered 2015-07-13 – 2015-07-17 (×5): 324 mg via ORAL
  Filled 2015-07-12 (×5): qty 1

## 2015-07-12 NOTE — ED Notes (Signed)
MD at bedside. 

## 2015-07-12 NOTE — Telephone Encounter (Signed)
Lollie Marrow from [facility] called about concerns with the patient-- Patient can't stay awake, oxygen level is 84% without oxygen, 88-92% with 2 liters of oxygen, BP 108/62, left side of mouth is drooping some  Lollie Marrow wants a return call [phone]

## 2015-07-12 NOTE — ED Notes (Signed)
Pt satting at 89% on 2L, increased O2 to 4L, sats 92%
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
## 2015-07-12 NOTE — ED Notes (Signed)
Pt's son reports pt weaker on L side than R side at home with L sided facial droop.

## 2015-07-12 NOTE — Progress Notes (Signed)
Gerald Dyer is a 82 y.o. male patient admitted from ED awake, alert - oriented  X 1 - no acute distress noted.  VSS - Blood pressure 123/77, pulse 77, temperature 97.9 F (36.6 C), temperature source Oral, resp. rate 20, SpO2 92 %.   On 2L nasal cannula.  IV in place, occlusive dsg intact without redness.  Orientation to room, and floor completed with information packet given to patient/family.  Patient declined safety video at this time.  Admission INP armband ID verified with patient/family, and in place.   SR up x 2, fall assessment complete, with patient and family able to verbalize understanding of risk associated with falls, and verbalized understanding to call nsg before up out of bed.  Call light within reach, patient able to voice, and demonstrate understanding.  Skin, clean-dry- intact without evidence of bruising, or skin tears.   No evidence of skin break down noted on exam.     Will cont to eval and treat per MD orders.  Elon Jester, RN 07/12/2015 9:00 PM

## 2015-07-12 NOTE — ED Provider Notes (Signed)
CSN: [id number]     Arrival date & time 07/12/15  1538 History   First MD Initiated Contact with Patient 07/12/15 1548     Chief Complaint  Patient presents with  . Weakness     (Consider location/radiation/quality/duration/timing/severity/associated sxs/prior Treatment) Patient is a 82 y.o. male presenting with weakness. The history is provided by a relative. The history is limited by the condition of the patient (Altered mental status).  Weakness  Today, caregivers noted that he had a left facial droop and he was having weakness of his left leg and was unable to bear weight. Last known normal was yesterday. He has not had any fever, chills, sweats. There's been no vomiting or diarrhea. Son states that the he has been confused and asking questions about another son who had died many years ago. Also, he is generally resistant regarding go wearing his oxygen at home.  Past Medical History  Diagnosis Date  . ADENOCARCINOMA, COLON, HX OF 09/21/2008  . ANEMIA-IRON DEFICIENCY 05/23/2009  . ANGIODYSPLASIA-INTESTINE 05/23/2009  . CHEST PAIN, PLEURITIC 11/21/2008  . COPD 02/01/2010  . DEPRESSION 09/21/2008  . FECAL OCCULT BLOOD 11/08/2009  . GERD 09/21/2008  . HYPERTENSION 11/01/2008  . INSOMNIA 04/23/2010  . Memory loss 01/08/2009  . 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 SUBDURAL HEMATOMA 09/21/2008  . Pulmonary collapse 06/11/2009  . UNSPECIFIED BACTERIAL PNEUMONIA 02/14/2010  . Aorta aneurysm [facility])    Past Surgical History  Procedure Laterality Date  . Cholecystectomy    . Aortic valve replacement    . Partial colectomy      cancered removed   Family History  Problem Relation Age of Onset  . Heart disease Father   . Prostate cancer Brother   . Cancer Father     kind unknown   Social History  Substance Use Topics  . Smoking status: Former Smoker -- 2.50 packs/day    Types: Cigarettes    Quit date: 06/23/1988  . Smokeless tobacco: None  . Alcohol Use: No    Review of Systems   Unable to perform ROS: Mental status change  Neurological: Positive for weakness.      Allergies  Codeine sulfate  Home Medications   Prior to Admission medications   Medication Sig Start Date End Date Taking? Authorizing Provider  albuterol (PROVENTIL) (2.5 MG/3ML) 0.083% nebulizer solution USE 1 VIAL IN NEBULIZER 4 TIMES DAILY FOR SHORTNESS OF BREATH J44.9 09/25/14   Eulas Post, MD  aspirin 81 MG tablet Take 81 mg by mouth daily.      Historical Provider, MD  donepezil (ARICEPT) 10 MG tablet TAKE 1 TABLET BY MOUTH DAILY 05/22/15   Eulas Post, MD  ferrous sulfate 325 (65 FE) MG tablet Take 325 mg by mouth daily with breakfast.     Historical Provider, MD  IRON, FERROUS GLUCONATE, PO Take 65 mg by mouth.    Historical Provider, MD  Multiple Vitamin (MULTIVITAMIN) tablet Take 1 tablet by mouth daily.     Historical Provider, MD  omeprazole (PRILOSEC) 20 MG capsule TAKE ONE CAPSULE BY MOUTH EVERY DAY 09/18/14   Eulas Post, MD  sertraline (ZOLOFT) 100 MG tablet TAKE 1 TABLET BY MOUTH EVERY DAY 09/18/14   Eulas Post, MD  SPIRIVA HANDIHALER 18 MCG inhalation capsule PLACE 1 CAPSULE INTO INHALER AND INHALE ONCE DAILY 05/22/15   Eulas Post, MD   BP 136/67 mmHg  Pulse 78  Temp(Src) 97.7 F (36.5 C) (Oral)  Resp 18  SpO2 94% Physical  Exam  Nursing note and vitals reviewed.  82 year old male, resting comfortably and in no acute distress. Vital signs are normal. Oxygen saturation is 94%, which is normal. Head is normocephalic and atraumatic. PERRLA, EOMI. Oropharynx is clear. Neck is nontender and supple without adenopathy or JVD. Back is nontender and there is no CVA tenderness. Lungs are clear without rales, wheezes, or rhonchi. Chest is nontender. Heart has regular rate and rhythm without murmur. Abdomen is soft, flat, nontender without masses or hepatosplenomegaly and peristalsis is normoactive. Extremities have no cyanosis or edema, full range of  motion is present. Moderate venous stasis changes are present. Skin is warm and dry without rash. Neurologic: He is awake and alert and oriented to person but not place or time, he follows commands but is slow to follow, cranial nerves are intact. There is slight weakness of the left leg with strength 4+/5. No facial droop is seen. Strength in all other extremities is 5/5. There is no pronator drift.  ED Course  Procedures (including critical care time) Labs Review Results for orders placed or performed during the hospital encounter of 07/12/15  Comprehensive metabolic panel  Result Value Ref Range   Sodium 140 135 - 145 mmol/L   Potassium 4.4 3.5 - 5.1 mmol/L   Chloride 108 101 - 111 mmol/L   CO2 22 22 - 32 mmol/L   Glucose, Bld 100 (H) 65 - 99 mg/dL   BUN 11 6 - 20 mg/dL   Creatinine, Ser 1.09 0.61 - 1.24 mg/dL   Calcium 9.2 8.9 - 10.3 mg/dL   Total Protein 6.3 (L) 6.5 - 8.1 g/dL   Albumin 3.6 3.5 - 5.0 g/dL   AST 26 15 - 41 U/L   ALT 19 17 - 63 U/L   Alkaline Phosphatase 84 38 - 126 U/L   Total Bilirubin 0.6 0.3 - 1.2 mg/dL   GFR calc non Af Amer 59 (L) >60 mL/min   GFR calc Af Amer >60 >60 mL/min   Anion gap 10 5 - 15  CBC with Differential  Result Value Ref Range   WBC 7.6 4.0 - 10.5 K/uL   RBC 5.56 4.22 - 5.81 MIL/uL   Hemoglobin 16.9 13.0 - 17.0 g/dL   HCT 50.4 39.0 - 52.0 %   MCV 90.6 78.0 - 100.0 fL   MCH 30.4 26.0 - 34.0 pg   MCHC 33.5 30.0 - 36.0 g/dL   RDW 15.4 11.5 - 15.5 %   Platelets 127 (L) 150 - 400 K/uL   Neutrophils Relative % 73 %   Neutro Abs 5.5 1.7 - 7.7 K/uL   Lymphocytes Relative 21 %   Lymphs Abs 1.6 0.7 - 4.0 K/uL   Monocytes Relative 4 %   Monocytes Absolute 0.3 0.1 - 1.0 K/uL   Eosinophils Relative 2 %   Eosinophils Absolute 0.2 0.0 - 0.7 K/uL   Basophils Relative 0 %   Basophils Absolute 0.0 0.0 - 0.1 K/uL  Troponin I  Result Value Ref Range   Troponin I <0.03 <0.031 ng/mL  I-Stat arterial blood gas, ED  Result Value Ref Range   pH,  Arterial 7.368 7.350 - 7.450   pCO2 arterial 40.6 35.0 - 45.0 mmHg   pO2, Arterial 63.0 (L) 80.0 - 100.0 mmHg   Bicarbonate 23.3 20.0 - 24.0 mEq/L   TCO2 25 0 - 100 mmol/L   O2 Saturation 91.0 %   Acid-base deficit 2.0 0.0 - 2.0 mmol/L   Patient temperature 98.6 F  Collection site RADIAL, ALLEN'S TEST ACCEPTABLE    Sample type ARTERIAL    Imaging Review Dg Chest 2 View  07/12/2015  CLINICAL DATA:  Pt weak and not acting right today per nurse notes. Pt could not say why he was here, was confused. Hx of COPD, HTN. EXAM: CHEST  2 VIEW COMPARISON:  06/17/2015 FINDINGS: Status post median sternotomy and CABG. The heart is enlarged and stable in configuration. There is prominence of interstitial markings. Patchy density at the left lung base is partially chronic. However the left hemidiaphragm is partially obscured posteriorly, suspicious for confluent acute infiltrate in the posterior left lower lobe. IMPRESSION: 1. Cardiomegaly and chronic lung changes. 2. Suspect increased infiltrate in the posterior left lower lobe. Electronically Signed   By: Nolon Nations M.D.   On: 07/12/2015 16:58   Ct Head Wo Contrast  07/12/2015  CLINICAL DATA:  Altered mental status, left-sided facial droop. EXAM: CT HEAD WITHOUT CONTRAST TECHNIQUE: Contiguous axial images were obtained from the base of the skull through the vertex without intravenous contrast. COMPARISON:  CT scan of June 17, 2015. FINDINGS: Status post right frontal and parietal craniotomy. Stable diffuse cortical atrophy is noted. Mild chronic ischemic white matter disease is noted. No mass effect or midline shift is noted. Ventricular size is within normal limits. There is no evidence of mass lesion, hemorrhage or acute infarction. IMPRESSION: Stable diffuse cortical atrophy. Mild chronic ischemic white matter disease. No acute intracranial abnormality seen. Electronically Signed   By: Marijo Conception, M.D.   On: 07/12/2015 17:14   I have  personally reviewed and evaluated these images and lab results as part of my medical decision-making.   EKG Interpretation   Date/Time:  Thursday July 12 2015 15:50:18 EST Ventricular Rate:  81 PR Interval:  157 QRS Duration: 109 QT Interval:  447 QTC Calculation: 519 R Axis:   -47 Text Interpretation:  Sinus rhythm Multiform ventricular premature  complexes Left anterior fascicular block Abnormal R-wave progression,  early transition Repol abnrm suggests ischemia, lateral leads Minimal ST  elevation, lateral leads Baseline wander in lead(s) V3 V5 V6 When compared  with ECG of 06/17/2015, No significant change was found Confirmed by [facility]   MD, Merryn Thaker ([id number]) on 07/12/2015 3:53:16 PM      MDM   Final diagnoses:  Altered mental status, unspecified altered mental status type  Community acquired pneumonia    Mental status change and new left-sided weakness. Facial droop and weakness appeared to have resolved. This could be related to stroke, TIA, or general response to hypoxia from is not wearing his oxygen. Old records are reviewed and he was seen in the ED 3 weeks ago with a fall and similar problems with altered mentation and not wearing oxygen. He does not qualify for code stroke status given the last our normal time was yesterday. However, stroke workup is initiated.  ED workup is significant for ex-her report of increased infiltrate in the posterior left lower lobe so he is started on antibiotics for community-acquired pneumonia. Case is discussed with Dr. Loleta Books of [facility] who agrees to admit the patient.  Delora Fuel, MD [id number] [phone]

## 2015-07-12 NOTE — ED Notes (Signed)
Patient transported to CT 

## 2015-07-12 NOTE — Telephone Encounter (Signed)
Called and spoke to [place]. Gerald Dyer states daughter-in-law went ahead and called 911; EMS is currently at their home. Advised to call us if we can assist any further, as we agree patient needs further evaluation due to symptoms. Gerald Dyer verbalized understanding.

## 2015-07-13 LAB — BASIC METABOLIC PANEL
ANION GAP: 9 (ref 5–15)
BUN: 11 mg/dL (ref 6–20)
CALCIUM: 9.3 mg/dL (ref 8.9–10.3)
CO2: 28 mmol/L (ref 22–32)
CREATININE: 1.21 mg/dL (ref 0.61–1.24)
Chloride: 106 mmol/L (ref 101–111)
GFR calc Af Amer: 60 mL/min — ABNORMAL LOW (ref >60)
GFR, EST NON AFRICAN AMERICAN: 52 mL/min — AB (ref >60)
GLUCOSE: 101 mg/dL — AB (ref 65–99)
Potassium: 4.2 mmol/L (ref 3.5–5.1)
Sodium: 143 mmol/L (ref 135–145)

## 2015-07-13 LAB — CBC
HCT: 53.4 % — ABNORMAL HIGH (ref 39.0–52.0)
Hemoglobin: 17.1 g/dL — ABNORMAL HIGH (ref 13.0–17.0)
MCH: 29.4 pg (ref 26.0–34.0)
MCHC: 32 g/dL (ref 30.0–36.0)
MCV: 91.9 fL (ref 78.0–100.0)
PLATELETS: 118 10*3/uL — AB (ref 150–400)
RBC: 5.81 MIL/uL (ref 4.22–5.81)
RDW: 15.3 % (ref 11.5–15.5)
WBC: 7.3 10*3/uL (ref 4.0–10.5)

## 2015-07-13 LAB — GLUCOSE, CAPILLARY: Glucose-Capillary: 124 mg/dL — ABNORMAL HIGH (ref 65–99)

## 2015-07-13 LAB — CREATININE, SERUM
Creatinine, Ser: 1.18 mg/dL (ref 0.61–1.24)
GFR, EST NON AFRICAN AMERICAN: 53 mL/min — AB (ref >60)

## 2015-07-13 LAB — PROCALCITONIN: PROCALCITONIN: 0.2 ng/mL

## 2015-07-13 MED ORDER — ASPIRIN EC 325 MG PO TBEC
325.0000 mg | DELAYED_RELEASE_TABLET | Freq: Every day | ORAL | Status: DC
  Administered 2015-07-13 – 2015-07-17 (×5): 325 mg via ORAL
  Filled 2015-07-13 (×5): qty 1

## 2015-07-13 MED ORDER — IPRATROPIUM-ALBUTEROL 0.5-2.5 (3) MG/3ML IN SOLN
3.0000 mL | Freq: Four times a day (QID) | RESPIRATORY_TRACT | Status: DC
  Filled 2015-07-13: qty 3

## 2015-07-13 MED ORDER — CEFTRIAXONE SODIUM 2 G IJ SOLR
2.0000 g | INTRAMUSCULAR | Status: DC
  Administered 2015-07-13 – 2015-07-16 (×4): 2 g via INTRAVENOUS
  Filled 2015-07-13 (×4): qty 2

## 2015-07-13 MED ORDER — AZITHROMYCIN 500 MG IV SOLR
500.0000 mg | INTRAVENOUS | Status: DC
  Administered 2015-07-13 – 2015-07-16 (×4): 500 mg via INTRAVENOUS
  Filled 2015-07-13 (×5): qty 500

## 2015-07-13 MED ORDER — IPRATROPIUM-ALBUTEROL 0.5-2.5 (3) MG/3ML IN SOLN
3.0000 mL | RESPIRATORY_TRACT | Status: DC
  Administered 2015-07-13 (×2): 3 mL via RESPIRATORY_TRACT
  Filled 2015-07-13 (×2): qty 3

## 2015-07-13 MED ORDER — IPRATROPIUM-ALBUTEROL 0.5-2.5 (3) MG/3ML IN SOLN: 3.0000 mL | Freq: Four times a day (QID) | RESPIRATORY_TRACT | Status: DC
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
## 2015-07-13 NOTE — Clinical Documentation Improvement (Signed)
Hospitalist  Can the diagnosis of altered mental status be further specified?   Confusion/delirium (including drug induced)  Transient alteration of awareness  Encephalopathy - Alcoholic, Anoxic/Hypoxia, Drug Induced/Toxic (specify drug), Hepatic, Hypertensive, Hypoglycemic, Metabolic/Septic, Traumatic/post concussive, Wernicke, Other  Other  Clinically Undetermined  Document any associated diagnoses/conditions.   Supporting Information: Patient presents with AMS per 01/19 ED note.   Please exercise your independent, professional judgment when responding. A specific answer is not anticipated or expected.   Thank You,  Winston [phone]
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
## 2015-07-13 NOTE — Progress Notes (Signed)
Utilization review completed. Dera Vanaken, RN, BSN. 

## 2015-07-14 LAB — CBC
HEMATOCRIT: 50.5 % (ref 39.0–52.0)
HEMOGLOBIN: 16.5 g/dL (ref 13.0–17.0)
MCH: 30.2 pg (ref 26.0–34.0)
MCHC: 32.7 g/dL (ref 30.0–36.0)
MCV: 92.3 fL (ref 78.0–100.0)
Platelets: 119 10*3/uL — ABNORMAL LOW (ref 150–400)
RBC: 5.47 MIL/uL (ref 4.22–5.81)
RDW: 15.4 % (ref 11.5–15.5)
WBC: 8.7 10*3/uL (ref 4.0–10.5)

## 2015-07-14 LAB — BASIC METABOLIC PANEL
ANION GAP: 8 (ref 5–15)
BUN: 13 mg/dL (ref 6–20)
CO2: 29 mmol/L (ref 22–32)
Calcium: 9.1 mg/dL (ref 8.9–10.3)
Chloride: 108 mmol/L (ref 101–111)
Creatinine, Ser: 1.37 mg/dL — ABNORMAL HIGH (ref 0.61–1.24)
GFR calc Af Amer: 51 mL/min — ABNORMAL LOW (ref >60)
GFR, EST NON AFRICAN AMERICAN: 44 mL/min — AB (ref >60)
GLUCOSE: 101 mg/dL — AB (ref 65–99)
POTASSIUM: 4.1 mmol/L (ref 3.5–5.1)
Sodium: 145 mmol/L (ref 135–145)

## 2015-07-14 LAB — RESPIRATORY VIRUS PANEL
Adenovirus: NEGATIVE
Influenza A: NEGATIVE
Influenza B: NEGATIVE
Metapneumovirus: NEGATIVE
PARAINFLUENZA 2 A: NEGATIVE
Parainfluenza 1: NEGATIVE
Parainfluenza 3: NEGATIVE
RESPIRATORY SYNCYTIAL VIRUS A: NEGATIVE
RESPIRATORY SYNCYTIAL VIRUS B: NEGATIVE
RHINOVIRUS: NEGATIVE

## 2015-07-14 LAB — PROCALCITONIN: PROCALCITONIN: 0.19 ng/mL

## 2015-07-14 MED ORDER — METHYLPREDNISOLONE SODIUM SUCC 125 MG IJ SOLR
125.0000 mg | Freq: Once | INTRAMUSCULAR | Status: AC
  Administered 2015-07-14: 125 mg via INTRAVENOUS
  Filled 2015-07-14: qty 2

## 2015-07-14 MED ORDER — IPRATROPIUM-ALBUTEROL 0.5-2.5 (3) MG/3ML IN SOLN
3.0000 mL | RESPIRATORY_TRACT | Status: DC
Start: 2015-07-14 — End: 2015-07-15
  Administered 2015-07-14 – 2015-07-15 (×6): 3 mL via RESPIRATORY_TRACT
  Filled 2015-07-14 (×6): qty 3

## 2015-07-14 MED ORDER — BUDESONIDE-FORMOTEROL FUMARATE 80-4.5 MCG/ACT IN AERO
2.0000 | INHALATION_SPRAY | Freq: Two times a day (BID) | RESPIRATORY_TRACT | Status: DC
  Administered 2015-07-14 – 2015-07-16 (×6): 2 via RESPIRATORY_TRACT
  Filled 2015-07-14: qty 6.9

## 2015-07-14 MED ORDER — METHYLPREDNISOLONE SODIUM SUCC 125 MG IJ SOLR
60.0000 mg | Freq: Three times a day (TID) | INTRAMUSCULAR | Status: DC
  Administered 2015-07-14 – 2015-07-15 (×3): 60 mg via INTRAVENOUS
  Filled 2015-07-14 (×3): qty 2

## 2015-07-14 NOTE — Progress Notes (Signed)
Neb not given pt is sleeping comfortably at this time., pt was assess no wheezing noted, no increase WOB noted. Pt is stable at this time. PRN neb treatment will be given if needed.
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
## 2015-07-15 LAB — BASIC METABOLIC PANEL
Anion gap: 9 (ref 5–15)
BUN: 22 mg/dL — AB (ref 6–20)
CALCIUM: 9.3 mg/dL (ref 8.9–10.3)
CHLORIDE: 107 mmol/L (ref 101–111)
CO2: 24 mmol/L (ref 22–32)
CREATININE: 1.49 mg/dL — AB (ref 0.61–1.24)
GFR calc non Af Amer: 40 mL/min — ABNORMAL LOW (ref >60)
GFR, EST AFRICAN AMERICAN: 46 mL/min — AB (ref >60)
Glucose, Bld: 138 mg/dL — ABNORMAL HIGH (ref 65–99)
Potassium: 5.1 mmol/L (ref 3.5–5.1)
SODIUM: 140 mmol/L (ref 135–145)

## 2015-07-15 LAB — CBC
HCT: 51.9 % (ref 39.0–52.0)
HEMOGLOBIN: 16.8 g/dL (ref 13.0–17.0)
MCH: 29.8 pg (ref 26.0–34.0)
MCHC: 32.4 g/dL (ref 30.0–36.0)
MCV: 92.2 fL (ref 78.0–100.0)
Platelets: 122 10*3/uL — ABNORMAL LOW (ref 150–400)
RBC: 5.63 MIL/uL (ref 4.22–5.81)
RDW: 15.3 % (ref 11.5–15.5)
WBC: 12.5 10*3/uL — ABNORMAL HIGH (ref 4.0–10.5)

## 2015-07-15 MED ORDER — METHYLPREDNISOLONE SODIUM SUCC 125 MG IJ SOLR
60.0000 mg | Freq: Two times a day (BID) | INTRAMUSCULAR | Status: DC
  Administered 2015-07-15 – 2015-07-16 (×2): 60 mg via INTRAVENOUS
  Filled 2015-07-15 (×2): qty 2

## 2015-07-15 MED ORDER — IPRATROPIUM-ALBUTEROL 0.5-2.5 (3) MG/3ML IN SOLN
3.0000 mL | Freq: Four times a day (QID) | RESPIRATORY_TRACT | Status: DC
  Administered 2015-07-15 (×2): 3 mL via RESPIRATORY_TRACT
  Filled 2015-07-15 (×2): qty 3

## 2015-07-15 MED ORDER — IPRATROPIUM-ALBUTEROL 0.5-2.5 (3) MG/3ML IN SOLN
3.0000 mL | Freq: Three times a day (TID) | RESPIRATORY_TRACT | Status: DC
  Administered 2015-07-16 – 2015-07-17 (×4): 3 mL via RESPIRATORY_TRACT
  Filled 2015-07-15 (×5): qty 3
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
## 2015-07-16 LAB — CBC
HEMATOCRIT: 50.4 % (ref 39.0–52.0)
Hemoglobin: 16.6 g/dL (ref 13.0–17.0)
MCH: 30.1 pg (ref 26.0–34.0)
MCHC: 32.9 g/dL (ref 30.0–36.0)
MCV: 91.5 fL (ref 78.0–100.0)
Platelets: 125 10*3/uL — ABNORMAL LOW (ref 150–400)
RBC: 5.51 MIL/uL (ref 4.22–5.81)
RDW: 15.2 % (ref 11.5–15.5)
WBC: 10.5 10*3/uL (ref 4.0–10.5)

## 2015-07-16 LAB — BASIC METABOLIC PANEL
Anion gap: 12 (ref 5–15)
BUN: 25 mg/dL — ABNORMAL HIGH (ref 6–20)
CALCIUM: 9.3 mg/dL (ref 8.9–10.3)
CO2: 21 mmol/L — AB (ref 22–32)
CREATININE: 1.29 mg/dL — AB (ref 0.61–1.24)
Chloride: 108 mmol/L (ref 101–111)
GFR calc Af Amer: 55 mL/min — ABNORMAL LOW (ref >60)
GFR calc non Af Amer: 48 mL/min — ABNORMAL LOW (ref >60)
GLUCOSE: 147 mg/dL — AB (ref 65–99)
Potassium: 4.4 mmol/L (ref 3.5–5.1)
Sodium: 141 mmol/L (ref 135–145)

## 2015-07-16 LAB — PROCALCITONIN: Procalcitonin: 0.29 ng/mL

## 2015-07-16 MED ORDER — FUROSEMIDE 40 MG PO TABS
40.0000 mg | ORAL_TABLET | Freq: Every day | ORAL | Status: AC
  Administered 2015-07-16 – 2015-07-17 (×2): 40 mg via ORAL
  Filled 2015-07-16 (×2): qty 1

## 2015-07-16 MED ORDER — PREDNISONE 20 MG PO TABS
40.0000 mg | ORAL_TABLET | Freq: Every day | ORAL | Status: DC
Start: 2015-07-16 — End: 2015-07-17
  Administered 2015-07-16 – 2015-07-17 (×2): 40 mg via ORAL
  Filled 2015-07-16 (×2): qty 2

## 2015-07-16 MED ORDER — FUROSEMIDE 40 MG PO TABS: 40.0000 mg | ORAL_TABLET | Freq: Every day | ORAL | Status: DC

## 2015-07-16 NOTE — Care Management Important Message (Signed)
Important Message  Patient Details  Name: BRANN BARSCH MRN: [id number] Date of Birth: 1924-07-18   Medicare Important Message Given:  Yes    Analee Montee Abena 07/16/2015, 4:00 PM

## 2015-07-16 NOTE — Progress Notes (Signed)
UR COMPLETED  
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
## 2015-07-16 NOTE — Progress Notes (Signed)
SATURATION QUALIFICATIONS: (This note is used to comply with regulatory documentation for home oxygen)  Patient Saturations on Room Air at Rest = 90%  Patient Saturations on Room Air while Ambulating = 81%   Patient Saturations on 3 Liters of oxygen while Ambulating = 88% (back up to 92% when resting)  Please briefly explain why patient needs home oxygen: desats on exertion

## 2015-07-17 LAB — BASIC METABOLIC PANEL
ANION GAP: 12 (ref 5–15)
BUN: 28 mg/dL — ABNORMAL HIGH (ref 6–20)
CHLORIDE: 105 mmol/L (ref 101–111)
CO2: 25 mmol/L (ref 22–32)
Calcium: 9.2 mg/dL (ref 8.9–10.3)
Creatinine, Ser: 1.38 mg/dL — ABNORMAL HIGH (ref 0.61–1.24)
GFR calc non Af Amer: 44 mL/min — ABNORMAL LOW (ref >60)
GFR, EST AFRICAN AMERICAN: 51 mL/min — AB (ref >60)
Glucose, Bld: 97 mg/dL (ref 65–99)
POTASSIUM: 3.6 mmol/L (ref 3.5–5.1)
SODIUM: 142 mmol/L (ref 135–145)

## 2015-07-17 MED ORDER — AZITHROMYCIN 500 MG PO TABS
500.0000 mg | ORAL_TABLET | Freq: Every day | ORAL | Status: DC
  Administered 2015-07-17: 500 mg via ORAL
  Filled 2015-07-17: qty 1

## 2015-07-17 MED ORDER — PREDNISONE 10 MG PO TABS: ORAL_TABLET | ORAL | Status: DC

## 2015-07-17 MED ORDER — AZITHROMYCIN 500 MG PO TABS: 500.0000 mg | ORAL_TABLET | Freq: Every day | ORAL | Status: DC

## 2015-07-17 MED ORDER — BUDESONIDE-FORMOTEROL FUMARATE 80-4.5 MCG/ACT IN AERO: 2.0000 | INHALATION_SPRAY | Freq: Two times a day (BID) | RESPIRATORY_TRACT | Status: AC

## 2015-07-17 MED ORDER — CEFUROXIME AXETIL 500 MG PO TABS: 500.0000 mg | ORAL_TABLET | Freq: Two times a day (BID) | ORAL | Status: DC

## 2015-07-17 MED ORDER — CEFUROXIME AXETIL 500 MG PO TABS
500.0000 mg | ORAL_TABLET | Freq: Two times a day (BID) | ORAL | Status: DC
  Administered 2015-07-17: 500 mg via ORAL
  Filled 2015-07-17 (×3): qty 1

## 2015-07-17 MED ORDER — FUROSEMIDE 20 MG PO TABS: 20.0000 mg | ORAL_TABLET | Freq: Every day | ORAL | Status: AC

## 2015-07-17 NOTE — Progress Notes (Signed)
Reviewed discharge paperwork with son and prescriptions given.  PIV removed.  Pt denied any other needs at this time.  Pt taken to discharge location via wheelchair.
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
## 2015-07-18 ENCOUNTER — Telehealth: Payer: Self-pay | Admitting: *Deleted

## 2015-07-18 NOTE — Telephone Encounter (Signed)
Transition Care Management Follow-up Telephone Call  How have you been since you were released from the hospital? Doing a little better   Do you understand why you were in the hospital? Yes - reviewed with son   Do you understand the discharge instrcutions? Yes - reviewed with son  Items Reviewed:  Medications reviewed: yes  Allergies reviewed: yes  Dietary changes reviewed: yes  Referrals reviewed: yes   Functional Questionnaire:   Activities of Daily Living (ADLs):   He states they are independent in the following: ambulation, bathing and hygiene, feeding and continence States they require assistance with the following: none   Any transportation issues/concerns?: no -sons will bring him   Any patient concerns? no   Confirmed importance and date/time of follow-up visits scheduled: yes   Confirmed with patient if condition begins to worsen call PCP or go to the ER.  Patient was given the Call-a-Nurse line [phone]: yes Patient was discharged 07/17/15 Patient was discharged to his home Patient has an appointment 07/20/15 with Dr Elease Hashimoto

## 2015-07-20 ENCOUNTER — Encounter: Payer: Self-pay | Admitting: Family Medicine

## 2015-07-20 ENCOUNTER — Ambulatory Visit (INDEPENDENT_AMBULATORY_CARE_PROVIDER_SITE_OTHER): Payer: Medicare Other | Admitting: Family Medicine

## 2015-07-20 ENCOUNTER — Telehealth: Payer: Self-pay | Admitting: *Deleted

## 2015-07-20 VITALS — BP 112/60 | HR 82 | Temp 97.6°F | Ht 72.0 in

## 2015-07-20 DIAGNOSIS — F0390 Unspecified dementia without behavioral disturbance: Secondary | ICD-10-CM | POA: Diagnosis not present

## 2015-07-20 DIAGNOSIS — R531 Weakness: Secondary | ICD-10-CM

## 2015-07-20 DIAGNOSIS — J441 Chronic obstructive pulmonary disease with (acute) exacerbation: Secondary | ICD-10-CM | POA: Diagnosis not present

## 2015-07-20 DIAGNOSIS — J189 Pneumonia, unspecified organism: Secondary | ICD-10-CM

## 2015-07-20 NOTE — Telephone Encounter (Signed)
Per Dr Elease Hashimoto I called [facility] [phone]) and gave verbal orders to [facility] for a BMET and CBC in 2 weeks.  She stated she will forward this to the nurse for the pt.

## 2015-07-20 NOTE — Progress Notes (Signed)
Pre visit review using our clinic review tool, if applicable. No additional management support is needed unless otherwise documented below in the visit note. 

## 2015-07-20 NOTE — Patient Instructions (Signed)
Finish out antibiotics and prednisone Follow up for any fever or increased shortness of breath We will try to get [facility] to draw his labs-BMP and CBC in a couple of weeks

## 2015-07-20 NOTE — Progress Notes (Signed)
Subjective:    Patient ID: Gerald Dyer, male    DOB: 1924/08/08, 82 y.o.   MRN: [id number]  HPI Multiple chronic problems including history of dementia, chronic kidney disease, hypertension, chronic mild thrombocytopenia, COPD, history of CVA. He is getting some home physical therapy and therapist thought he may have had some left-sided weakness. He was taken to hospital for further evaluation and was admitted on 07/12/2015. He was discharged 5 days later.  Patient also had question of some mild facial drooping. CT head unremarkable. Chest x-ray showed left lower lobe opacity and concern for worsening pneumonia. After long discussion with family decision was made not to do further noninvasive or invasive testing such as carotid Dopplers or echo as family did not feel he would be a candidate for interventional measures if he had abnormality. They increased his aspirin 325 mg daily.  His left sided weakness improved apparently prior to discharge.  COPD exacerbation with left lower lobe pneumonia. He was treated initially with IV Rocephin and Zithromax and avoidance of fluoroquinolones because of QT prolongation. He was discharged on Ceftin and Zithromax along with prednisone. Patient was diuresed during hospitalization and received Lasix at discharge 20 mg daily. No edema since discharge. He is eating well and drinking well. Son thinks his strength is coming back. He has home physical therapy set up.  He has underlying dementia and is at baseline according to his son.  Past Medical History  Diagnosis Date  . ADENOCARCINOMA, COLON, HX OF 09/21/2008  . ANEMIA-IRON DEFICIENCY 05/23/2009  . ANGIODYSPLASIA-INTESTINE 05/23/2009  . CHEST PAIN, PLEURITIC 11/21/2008  . COPD 02/01/2010  . DEPRESSION 09/21/2008  . FECAL OCCULT BLOOD 11/08/2009  . GERD 09/21/2008  . HYPERTENSION 11/01/2008  . INSOMNIA 04/23/2010  . Memory loss 01/08/2009  . 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 SUBDURAL HEMATOMA 09/21/2008    . Pulmonary collapse 06/11/2009  . UNSPECIFIED BACTERIAL PNEUMONIA 02/14/2010  . Aorta aneurysm [facility])    Past Surgical History  Procedure Laterality Date  . Cholecystectomy    . Aortic valve replacement    . Partial colectomy      cancered removed    reports that he quit smoking about 27 years ago. His smoking use included Cigarettes. He smoked 2.50 packs per day. He does not have any smokeless tobacco history on file. He reports that he does not drink alcohol or use illicit drugs. family history includes Cancer in his father; Heart disease in his father; Prostate cancer in his brother. Allergies  Allergen Reactions  . Codeine Sulfate Other (See Comments)    Unknown      Review of Systems  Constitutional: Negative for fever and appetite change.       Minimal history obtainable from patient given his advanced dementia. Obtained from son and patient.  HENT: Negative for trouble swallowing.   Respiratory: Negative for shortness of breath.   Cardiovascular: Negative for chest pain.  Gastrointestinal: Negative for nausea, vomiting, abdominal pain and diarrhea.  Genitourinary: Negative for dysuria.  Skin: Negative for rash.  Neurological: Negative for syncope.  Psychiatric/Behavioral: Negative for agitation.       Objective:   Physical Exam  Constitutional: He appears well-developed and well-nourished.  HENT:  Mouth/Throat: Oropharynx is clear and moist.  Cardiovascular: Normal rate and regular rhythm.   Pulmonary/Chest:  Diminished breath sounds left base. No wheezes. No respiratory distress off oxygen.  Abdominal: Soft. There is no tenderness.  Musculoskeletal: He exhibits no edema.  Neurological: He is alert. No  cranial nerve deficit.  Did not attempt to ambulate.  Good grip strength bilateral.  No obvious strength deficits of upper or lower extremities. No facial droop at this time.          Assessment & Plan:  #1 COPD exacerbation with left lower lobe pneumonia.  Patient finishing out Ceftin and Zithromax. We discussed that normally would get follow-up chest x-ray to rule out persistent pneumonia/post-obstructive process but given his advanced medical problems son has decided he would not wish to pursue any further evaluation which seems reasonable.  #2 recent volume overload. We have ordered for repeat CBC and basic metabolic panel in approximately 2 weeks per home health.  Touch base for recurrent leg edema.  Home daily weights if possible.  #3 overall weakness. Continue home physical therapy We have ordered repeat CBC and CMP in 2 weeks.  #4 Advanced dementia.  He does not have any behavioral disturbance and would not benefit likely at this stage with medications.

## 2015-07-24 ENCOUNTER — Telehealth: Payer: Self-pay | Admitting: Family Medicine

## 2015-07-24 NOTE — Telephone Encounter (Signed)
Sharyn Lull w/[facility] would like the doctor to know that she resumed home therapy services.  She would like to see him twice a week for four weeks, then once a week for one week working on strengthening, balance, endurance and safety.  She wants to confirm how long he is to have oxygen.  Is it continuous or as needed?

## 2015-07-25 NOTE — Telephone Encounter (Signed)
Continuous

## 2015-07-25 NOTE — Telephone Encounter (Signed)
Sharyn Lull is aware of orders.

## 2015-08-04 ENCOUNTER — Other Ambulatory Visit: Payer: Self-pay | Admitting: Family Medicine

## 2015-08-07 ENCOUNTER — Other Ambulatory Visit: Payer: Self-pay | Admitting: Family Medicine

## 2015-08-07 MED ORDER — ALBUTEROL SULFATE (2.5 MG/3ML) 0.083% IN NEBU: INHALATION_SOLUTION | RESPIRATORY_TRACT | Status: AC

## 2015-08-13 ENCOUNTER — Inpatient Hospital Stay (HOSPITAL_COMMUNITY)
Admission: EM | Admit: 2015-08-13 | Discharge: 2015-08-14 | DRG: 190 | Disposition: A | Discharge status: Home / Self Care | Payer: Medicare Other | Attending: Family Medicine | Admitting: Family Medicine

## 2015-08-13 ENCOUNTER — Telehealth: Payer: Self-pay

## 2015-08-13 ENCOUNTER — Emergency Department (HOSPITAL_COMMUNITY): Payer: Medicare Other

## 2015-08-13 DIAGNOSIS — D509 Iron deficiency anemia, unspecified: Secondary | ICD-10-CM | POA: Diagnosis present

## 2015-08-13 DIAGNOSIS — Z800 Family history of malignant neoplasm of digestive organs: Secondary | ICD-10-CM

## 2015-08-13 DIAGNOSIS — J189 Pneumonia, unspecified organism: Secondary | ICD-10-CM

## 2015-08-13 DIAGNOSIS — Z8249 Family history of ischemic heart disease and other diseases of the circulatory system: Secondary | ICD-10-CM

## 2015-08-13 DIAGNOSIS — Z8673 Personal history of transient ischemic attack (TIA), and cerebral infarction without residual deficits: Secondary | ICD-10-CM

## 2015-08-13 DIAGNOSIS — Z9981 Dependence on supplemental oxygen: Secondary | ICD-10-CM

## 2015-08-13 DIAGNOSIS — F329 Major depressive disorder, single episode, unspecified: Secondary | ICD-10-CM | POA: Diagnosis present

## 2015-08-13 DIAGNOSIS — J441 Chronic obstructive pulmonary disease with (acute) exacerbation: Secondary | ICD-10-CM | POA: Diagnosis present

## 2015-08-13 DIAGNOSIS — Z87891 Personal history of nicotine dependence: Secondary | ICD-10-CM

## 2015-08-13 DIAGNOSIS — N182 Chronic kidney disease, stage 2 (mild): Secondary | ICD-10-CM | POA: Diagnosis present

## 2015-08-13 DIAGNOSIS — Z8042 Family history of malignant neoplasm of prostate: Secondary | ICD-10-CM | POA: Diagnosis not present

## 2015-08-13 DIAGNOSIS — I6200 Nontraumatic subdural hemorrhage, unspecified: Secondary | ICD-10-CM | POA: Diagnosis present

## 2015-08-13 DIAGNOSIS — J690 Pneumonitis due to inhalation of food and vomit: Secondary | ICD-10-CM | POA: Diagnosis present

## 2015-08-13 DIAGNOSIS — Z9049 Acquired absence of other specified parts of digestive tract: Secondary | ICD-10-CM | POA: Diagnosis not present

## 2015-08-13 DIAGNOSIS — Z66 Do not resuscitate: Secondary | ICD-10-CM | POA: Diagnosis present

## 2015-08-13 DIAGNOSIS — Q211 Atrial septal defect: Secondary | ICD-10-CM | POA: Diagnosis not present

## 2015-08-13 DIAGNOSIS — F0390 Unspecified dementia without behavioral disturbance: Secondary | ICD-10-CM | POA: Diagnosis present

## 2015-08-13 DIAGNOSIS — C189 Malignant neoplasm of colon, unspecified: Secondary | ICD-10-CM | POA: Diagnosis present

## 2015-08-13 DIAGNOSIS — T17908A Unspecified foreign body in respiratory tract, part unspecified causing other injury, initial encounter: Secondary | ICD-10-CM | POA: Diagnosis present

## 2015-08-13 DIAGNOSIS — T17998S Other foreign object in respiratory tract, part unspecified causing other injury, sequela: Secondary | ICD-10-CM | POA: Diagnosis not present

## 2015-08-13 LAB — CBC WITH DIFFERENTIAL/PLATELET
BASOS ABS: 0 10*3/uL (ref 0.0–0.1)
BASOS PCT: 0 %
EOS PCT: 3 %
Eosinophils Absolute: 0.2 10*3/uL (ref 0.0–0.7)
HCT: 49.1 % (ref 39.0–52.0)
Hemoglobin: 15.8 g/dL (ref 13.0–17.0)
Lymphocytes Relative: 21 %
Lymphs Abs: 1.3 10*3/uL (ref 0.7–4.0)
MCH: 29.4 pg (ref 26.0–34.0)
MCHC: 32.2 g/dL (ref 30.0–36.0)
MCV: 91.4 fL (ref 78.0–100.0)
MONO ABS: 0.4 10*3/uL (ref 0.1–1.0)
Monocytes Relative: 6 %
Neutro Abs: 4.4 10*3/uL (ref 1.7–7.7)
Neutrophils Relative %: 70 %
PLATELETS: 137 10*3/uL — AB (ref 150–400)
RBC: 5.37 MIL/uL (ref 4.22–5.81)
RDW: 15.9 % — AB (ref 11.5–15.5)
WBC: 6.4 10*3/uL (ref 4.0–10.5)

## 2015-08-13 LAB — BASIC METABOLIC PANEL
Anion gap: 12 (ref 5–15)
BUN: 13 mg/dL (ref 6–20)
CALCIUM: 8.9 mg/dL (ref 8.9–10.3)
CO2: 21 mmol/L — ABNORMAL LOW (ref 22–32)
Chloride: 109 mmol/L (ref 101–111)
Creatinine, Ser: 1.28 mg/dL — ABNORMAL HIGH (ref 0.61–1.24)
GFR calc Af Amer: 56 mL/min — ABNORMAL LOW (ref >60)
GFR, EST NON AFRICAN AMERICAN: 48 mL/min — AB (ref >60)
GLUCOSE: 97 mg/dL (ref 65–99)
Potassium: 4.2 mmol/L (ref 3.5–5.1)
Sodium: 142 mmol/L (ref 135–145)

## 2015-08-13 LAB — I-STAT VENOUS BLOOD GAS, ED
Acid-base deficit: 2 mmol/L (ref 0.0–2.0)
Bicarbonate: 23.8 mEq/L (ref 20.0–24.0)
O2 SAT: 72 %
PH VEN: 7.35 — AB (ref 7.250–7.300)
TCO2: 25 mmol/L (ref 0–100)
pCO2, Ven: 43.1 mmHg — ABNORMAL LOW (ref 45.0–50.0)
pO2, Ven: 40 mmHg (ref 30.0–45.0)

## 2015-08-13 LAB — I-STAT TROPONIN, ED: Troponin i, poc: 0.03 ng/mL (ref 0.00–0.08)

## 2015-08-13 MED ORDER — MORPHINE SULFATE (CONCENTRATE) 10 MG/0.5ML PO SOLN
5.0000 mg | ORAL | Status: DC | PRN
  Filled 2015-08-13: qty 0.5

## 2015-08-13 MED ORDER — BUDESONIDE-FORMOTEROL FUMARATE 80-4.5 MCG/ACT IN AERO
2.0000 | INHALATION_SPRAY | Freq: Two times a day (BID) | RESPIRATORY_TRACT | Status: DC
  Administered 2015-08-13 – 2015-08-14 (×2): 2 via RESPIRATORY_TRACT
  Filled 2015-08-13: qty 6.9

## 2015-08-13 MED ORDER — AZITHROMYCIN 250 MG PO TABS: 250.0000 mg | ORAL_TABLET | Freq: Every day | ORAL | Status: DC

## 2015-08-13 MED ORDER — BIOTENE DRY MOUTH MT LIQD: 15.0000 mL | OROMUCOSAL | Status: DC | PRN

## 2015-08-13 MED ORDER — IPRATROPIUM-ALBUTEROL 0.5-2.5 (3) MG/3ML IN SOLN
3.0000 mL | Freq: Once | RESPIRATORY_TRACT | Status: AC
  Administered 2015-08-13: 3 mL via RESPIRATORY_TRACT
  Filled 2015-08-13: qty 3

## 2015-08-13 MED ORDER — TIOTROPIUM BROMIDE MONOHYDRATE 18 MCG IN CAPS
18.0000 ug | ORAL_CAPSULE | Freq: Every day | RESPIRATORY_TRACT | Status: DC
  Administered 2015-08-14: 18 ug via RESPIRATORY_TRACT
  Filled 2015-08-13: qty 5

## 2015-08-13 MED ORDER — PREDNISONE 10 MG PO TABS: 40.0000 mg | ORAL_TABLET | Freq: Every day | ORAL | Status: DC

## 2015-08-13 MED ORDER — ALBUTEROL SULFATE (2.5 MG/3ML) 0.083% IN NEBU
2.5000 mg | INHALATION_SOLUTION | Freq: Four times a day (QID) | RESPIRATORY_TRACT | Status: DC
  Administered 2015-08-14 (×2): 2.5 mg via RESPIRATORY_TRACT
  Filled 2015-08-13 (×3): qty 3

## 2015-08-13 MED ORDER — AMOXICILLIN-POT CLAVULANATE 600-42.9 MG/5ML PO SUSR
800.0000 mg | Freq: Two times a day (BID) | ORAL | Status: DC
  Administered 2015-08-13 – 2015-08-14 (×2): 804 mg via ORAL
  Filled 2015-08-13 (×3): qty 6.7

## 2015-08-13 MED ORDER — SERTRALINE HCL 100 MG PO TABS
100.0000 mg | ORAL_TABLET | Freq: Every day | ORAL | Status: DC
  Administered 2015-08-14: 100 mg via ORAL
  Filled 2015-08-13: qty 1

## 2015-08-13 MED ORDER — ONDANSETRON HCL 4 MG/2ML IJ SOLN: 4.0000 mg | Freq: Four times a day (QID) | INTRAMUSCULAR | Status: DC | PRN

## 2015-08-13 MED ORDER — PREDNISONE 20 MG PO TABS
40.0000 mg | ORAL_TABLET | Freq: Once | ORAL | Status: AC
  Administered 2015-08-13: 40 mg via ORAL
  Filled 2015-08-13: qty 2

## 2015-08-13 MED ORDER — AZITHROMYCIN 250 MG PO TABS
500.0000 mg | ORAL_TABLET | Freq: Once | ORAL | Status: AC
  Administered 2015-08-13: 500 mg via ORAL
  Filled 2015-08-13: qty 2

## 2015-08-13 MED ORDER — CEFUROXIME AXETIL 500 MG PO TABS: 500.0000 mg | ORAL_TABLET | Freq: Two times a day (BID) | ORAL | Status: AC

## 2015-08-13 MED ORDER — HALOPERIDOL LACTATE 5 MG/ML IJ SOLN: 0.5000 mg | INTRAMUSCULAR | Status: DC | PRN

## 2015-08-13 MED ORDER — MORPHINE SULFATE (CONCENTRATE) 10 MG/0.5ML PO SOLN: 5.0000 mg | ORAL | Status: DC | PRN

## 2015-08-13 MED ORDER — GLYCOPYRROLATE 0.2 MG/ML IJ SOLN
0.2000 mg | INTRAMUSCULAR | Status: DC | PRN
  Filled 2015-08-13: qty 1

## 2015-08-13 MED ORDER — DONEPEZIL HCL 10 MG PO TABS
10.0000 mg | ORAL_TABLET | Freq: Every day | ORAL | Status: DC
Start: 2015-08-14 — End: 2015-08-14
  Administered 2015-08-14: 10 mg via ORAL
  Filled 2015-08-13: qty 1

## 2015-08-13 MED ORDER — ALBUTEROL SULFATE (2.5 MG/3ML) 0.083% IN NEBU
2.5000 mg | INHALATION_SOLUTION | RESPIRATORY_TRACT | Status: DC
  Administered 2015-08-13: 2.5 mg via RESPIRATORY_TRACT
  Filled 2015-08-13: qty 3

## 2015-08-13 MED ORDER — ACETAMINOPHEN 325 MG PO TABS
650.0000 mg | ORAL_TABLET | Freq: Four times a day (QID) | ORAL | Status: DC | PRN
Start: 2015-08-13 — End: 2015-08-14

## 2015-08-13 MED ORDER — CEFUROXIME AXETIL 500 MG PO TABS
500.0000 mg | ORAL_TABLET | Freq: Two times a day (BID) | ORAL | Status: DC
  Administered 2015-08-13: 500 mg via ORAL
  Filled 2015-08-13 (×2): qty 1

## 2015-08-13 MED ORDER — ACETAMINOPHEN 650 MG RE SUPP: 650.0000 mg | Freq: Four times a day (QID) | RECTAL | Status: DC | PRN

## 2015-08-13 MED ORDER — HALOPERIDOL 0.5 MG PO TABS
0.5000 mg | ORAL_TABLET | ORAL | Status: DC | PRN
  Filled 2015-08-13: qty 1

## 2015-08-13 MED ORDER — HALOPERIDOL LACTATE 2 MG/ML PO CONC
0.5000 mg | ORAL | Status: DC | PRN
  Filled 2015-08-13: qty 0.3

## 2015-08-13 MED ORDER — ONDANSETRON 4 MG PO TBDP: 4.0000 mg | ORAL_TABLET | Freq: Four times a day (QID) | ORAL | Status: DC | PRN

## 2015-08-13 MED ORDER — GLYCOPYRROLATE 1 MG PO TABS
1.0000 mg | ORAL_TABLET | ORAL | Status: DC | PRN
  Filled 2015-08-13: qty 1

## 2015-08-13 NOTE — Telephone Encounter (Signed)
Recommend ED with O2 that low and mental status changes.

## 2015-08-13 NOTE — Telephone Encounter (Signed)
Spoke with nurse at [facility] and notified her of Dr. Erick Blinks comments. Verbalized understanding. She states that patient's daughter in law is there now and they will take him immediatly.

## 2015-08-13 NOTE — ED Provider Notes (Signed)
CSN: [id number]     Arrival date & time 08/13/15  1459 History   First MD Initiated Contact with Patient 08/13/15 1513     Chief Complaint  Patient presents with  . Shortness of Breath     The history is provided by a relative and the patient. No language interpreter was used.   Gerald Dyer is a 82 y.o. male who presents to the Emergency Department complaining of SOB.  Hx is provided by the patient and his family.  He was at home with home health nursing today when he was noted to be hypoxic to the 70s when getting up to use the bathroom.  As a history of dementia and denies any current complaints. He has chronic shortness of breath and this is at his baseline. He does live at home alone but has caregivers throughout the day as well as home health nursing available. No recent fevers, lower extremity swelling, chest pain. Symptoms are moderate in waxing and waning. Family state that the patient has had intermittent hallucinations over the last several days including "going to see the light". Family is requesting home hospice evaluation.  Past Medical History  Diagnosis Date  . ADENOCARCINOMA, COLON, HX OF 09/21/2008  . ANEMIA-IRON DEFICIENCY 05/23/2009  . ANGIODYSPLASIA-INTESTINE 05/23/2009  . CHEST PAIN, PLEURITIC 11/21/2008  . COPD 02/01/2010  . DEPRESSION 09/21/2008  . FECAL OCCULT BLOOD 11/08/2009  . GERD 09/21/2008  . HYPERTENSION 11/01/2008  . INSOMNIA 04/23/2010  . Memory loss 01/08/2009  . 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 SUBDURAL HEMATOMA 09/21/2008  . Pulmonary collapse 06/11/2009  . UNSPECIFIED BACTERIAL PNEUMONIA 02/14/2010  . Aorta aneurysm [facility])    Past Surgical History  Procedure Laterality Date  . Cholecystectomy    . Aortic valve replacement    . Partial colectomy      cancered removed   Family History  Problem Relation Age of Onset  . Heart disease Father   . Prostate cancer Brother   . Cancer Father     kind unknown   Social History  Substance Use Topics   . Smoking status: Former Smoker -- 2.50 packs/day    Types: Cigarettes    Quit date: 06/23/1988  . Smokeless tobacco: Not on file  . Alcohol Use: No    Review of Systems  All other systems reviewed and are negative.     Allergies  Codeine sulfate  Home Medications   Prior to Admission medications   Medication Sig Start Date End Date Taking? Authorizing Provider  albuterol (PROVENTIL) (2.5 MG/3ML) 0.083% nebulizer solution USE 1 VIAL IN NEBULIZER 4 TIMES DAILY FOR SHORTNESS OF BREATH J44.9 08/07/15  Yes Eulas Post, MD  aspirin 81 MG tablet Take 81 mg by mouth daily.     Yes Historical Provider, MD  budesonide-formoterol (SYMBICORT) 80-4.5 MCG/ACT inhaler Inhale 2 puffs into the lungs 2 (two) times daily. 07/17/15  Yes Ripudeep K Rai, MD  donepezil (ARICEPT) 10 MG tablet TAKE 1 TABLET BY MOUTH DAILY 05/22/15  Yes Eulas Post, MD  ferrous sulfate 325 (65 FE) MG tablet Take 325 mg by mouth daily with breakfast.    Yes Historical Provider, MD  furosemide (LASIX) 20 MG tablet Take 1 tablet (20 mg total) by mouth daily. 07/17/15  Yes Ripudeep Krystal Eaton, MD  Multiple Vitamin (MULTIVITAMIN) tablet Take 1 tablet by mouth daily.    Yes Historical Provider, MD  omeprazole (PRILOSEC) 20 MG capsule TAKE 1 CAPSULE BY MOUTH EVERY DAY 08/04/15  Yes  Eulas Post, MD  sertraline (ZOLOFT) 100 MG tablet TAKE 1 TABLET BY MOUTH DAILY 08/04/15  Yes Eulas Post, MD  SPIRIVA HANDIHALER 18 MCG inhalation capsule PLACE 1 CAPSULE INTO INHALER AND INHALE ONCE DAILY 05/22/15  Yes Eulas Post, MD  azithromycin (ZITHROMAX) 250 MG tablet Take 1 tablet (250 mg total) by mouth daily. 08/13/15   Quintella Reichert, MD  cefUROXime (CEFTIN) 500 MG tablet Take 1 tablet (500 mg total) by mouth 2 (two) times daily with a meal. 08/13/15   Quintella Reichert, MD  predniSONE (DELTASONE) 10 MG tablet Take 4 tablets (40 mg total) by mouth daily. 08/13/15   Quintella Reichert, MD   BP 137/69 mmHg  Pulse 85  Temp(Src)  97.9 F (36.6 C) (Oral)  Resp 22  SpO2 94% Physical Exam  Constitutional: He appears well-developed and well-nourished.  HENT:  Head: Normocephalic and atraumatic.  Cardiovascular: Normal rate and regular rhythm.   No murmur heard. Pulmonary/Chest: Effort normal. No respiratory distress.  Occasional end expiratory wheezes bilaterally. No respiratory distress.  Abdominal: Soft. There is no tenderness. There is no rebound and no guarding.  Musculoskeletal: He exhibits no edema or tenderness.  Neurological: He is alert.  Mildly confused, oriented to place.  Skin: Skin is warm and dry.  Psychiatric: He has a normal mood and affect. His behavior is normal.  Nursing note and vitals reviewed.   ED Course  Procedures (including critical care time) Labs Review Labs Reviewed  BASIC METABOLIC PANEL - Abnormal; Notable for the following:    CO2 21 (*)    Creatinine, Ser 1.28 (*)    GFR calc non Af Amer 48 (*)    GFR calc Af Amer 56 (*)    All other components within normal limits  CBC WITH DIFFERENTIAL/PLATELET - Abnormal; Notable for the following:    RDW 15.9 (*)    Platelets 137 (*)    All other components within normal limits  I-STAT VENOUS BLOOD GAS, ED - Abnormal; Notable for the following:    pH, Ven 7.350 (*)    pCO2, Ven 43.1 (*)    All other components within normal limits  Randolm Idol, ED    Imaging Review Dg Chest 2 View  08/13/2015  CLINICAL DATA:  Worsening exertional dyspnea today. Recent pneumonia. EXAM: CHEST  2 VIEW COMPARISON:  05/11/2016 FINDINGS: Cardiomegaly and chronic pulmonary venous hypertension remains stable in appearance. Opacity is seen in the left retrocardiac lung base, which appears increased since previous study. This could be due to pulmonary airspace disease or underpenetration. No definite evidence of pleural effusion. Prior CABG again noted. IMPRESSION: Increased opacity in the left retrocardiac lung base, which could be due to pneumonia or  underpenetration. Recommend clinical correlation and short-term radiographic followup. Stable cardiomegaly and chronic pulmonary venous hypertension. Electronically Signed   By: Earle Gell M.D.   On: 08/13/2015 16:07   I have personally reviewed and evaluated these images and lab results as part of my medical decision-making.   EKG Interpretation   Date/Time:  Monday August 13 2015 15:04:47 EST Ventricular Rate:  79 PR Interval:  159 QRS Duration: 108 QT Interval:  428 QTC Calculation: 491 R Axis:   -29 Text Interpretation:  Sinus rhythm Multiform ventricular premature  complexes Borderline left axis deviation Abnormal R-wave progression,  early transition Borderline prolonged QT interval Confirmed by Hazle Coca  [phone]) on 08/13/2015 3:41:21 PM      MDM   Final diagnoses:  COPD exacerbation ([place])  Patient with history of COPD on home oxygen here with intermittent confusion and hypoxia with minimal exertion. He is in no respiratory distress and emergency departments but does have significant shortness of breath and hypoxia with minimal exertion. He lives at home alone. He would like to be set up with home hospice for COPD. Chest x-ray with possible infiltrate will treat for potential pneumonia. Plan to admit for symptom control until hospice evaluation can be completed.    Quintella Reichert, MD 08/13/15 [phone]

## 2015-08-13 NOTE — ED Notes (Signed)
Myself and Matt, RN ambulated patient from stretcher to room door and back to stretcher on oxygen [place] (3L); visitors at bedside

## 2015-08-13 NOTE — ED Notes (Signed)
Myself and Chelsea, RN undressed patient, placed in gown, on monitor, continuous pulse oximetry, blood pressure cuff and oxygen [place] (3L)

## 2015-08-13 NOTE — ED Notes (Signed)
Pt SpO2 dropped to 80% upon ambulation.

## 2015-08-13 NOTE — Telephone Encounter (Signed)
Arville Go called stating that patient's oxygen level has been very low. She states that the patient is on 2 liters of oxygen - & was diagnosed with pneumonia about 2 weeks ago. She states that she wanted to walk the patient a little bit, and so she walked him to the bathroom, and patient's oxygen level dropped to 78% with oxygen. She then states that after sitting in his chair for a while, his oxygen had dropped to 75% and started having hallucinations. Arville Go wants to know if patient needs to be seen in office? Or if they need to go to ED? Please advise.

## 2015-08-13 NOTE — Progress Notes (Signed)
Gerald Dyer received phone call from [facility] regarding referral for home hospice.  EDCM called patient's RN Gerald Dyer who transferred me to patient's son Gerald Dyer.  EDCM has received permission to speak to patient's son by patient.  Patient is interested in home hospice.  Patient currently receiving home health services by [place].  Patient currently has oxygen at home.  Patient has caregivers during the day but is alone at night.  Per EDP patient would like to go home.  EDCM offered choice of home hospice providers of which patient's son Gerald Dyer has chosen [facility].  EDCM provided patient's son with phone number to [place].  EDCM infromed patient's son they should hear from them within 24 hours.  EDCM asked patient's son if the patient had any further needs for dme?  No further dme needs at this time.  Patient's son with no further questions.  [facility] faxed referral to [facility] with confirmation of receipt.  No further EDCM needs at this time.  Patient's son Gerald Dyer phone number [phone].

## 2015-08-13 NOTE — ED Notes (Signed)
Per EMS, pt coming in today for exertional dyspnea x 5 hours. Pt has hx of pneumonia 2 weeks ago which he was admitted to the hospital. Pt alert to baseline. NAD at this time. Pts SpO2:96% on 4L [place].

## 2015-08-13 NOTE — ED Notes (Signed)
MD at bedside. 

## 2015-08-13 NOTE — H&P (Addendum)
[facility] History and Physical  QUENDARIUS BURROUS [id number] DOB: Oct 01, 1924 DOA: 08/13/2015  82 y/o ? Known COPD Recent admission 1/19-1/24/17 Acute hypoxic R failure and? TIA Prior TIA 04/16/05 Colon cancer s/p Colectomy 2006 Asc Aor Aneurysm s/p repair of AoV root with [employer] prosthesis 07/2005 Patent Foramen ovale Htn SDH s/p removal 03/2008 Anemia of Iron def Tcp Depression Severe dementia Baseline CKD stg II-III  Low sats noted at home and is on home oxygen since last admission 07/12/2015 [facility] the home health called his primary care physician and it was suggested that he be brought over to the emergency room   He is unable to give me any history secondary to profound dementia His sons are both in the room and they corroborate that patient has been declining since last 2 admissions to the hospital. He has had pneumonia 2 and is on a regular diet at home-family has noticed that after eating he occasionally does have cough He is usually a strong able gentleman able to ambulate go to the bathroom and do some activities for himself He has support of some caregivers at home who look in on him occasionally  Family did not notice any fevers chills nausea vomiting diarrhea third vision or double vision   Past Medical History  Diagnosis Date  . ADENOCARCINOMA, COLON, HX OF 09/21/2008  . ANEMIA-IRON DEFICIENCY 05/23/2009  . ANGIODYSPLASIA-INTESTINE 05/23/2009  . CHEST PAIN, PLEURITIC 11/21/2008  . COPD 02/01/2010  . DEPRESSION 09/21/2008  . FECAL OCCULT BLOOD 11/08/2009  . GERD 09/21/2008  . HYPERTENSION 11/01/2008  . INSOMNIA 04/23/2010  . Memory loss 01/08/2009  . 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 SUBDURAL HEMATOMA 09/21/2008  . Pulmonary collapse 06/11/2009  . UNSPECIFIED BACTERIAL PNEUMONIA 02/14/2010  . Aorta aneurysm [facility])    Past Surgical History  Procedure Laterality Date  . Cholecystectomy    . Aortic valve replacement    . Partial colectomy       cancered removed   Social History:  Social History   Social History Narrative    Allergies  Allergen Reactions  . Codeine Sulfate Other (See Comments)    Unknown    Family History  Problem Relation Age of Onset  . Heart disease Father   . Prostate cancer Brother   . Cancer Father     kind unknown    Prior to Admission medications   Medication Sig Start Date End Date Taking? Authorizing Provider  albuterol (PROVENTIL) (2.5 MG/3ML) 0.083% nebulizer solution USE 1 VIAL IN NEBULIZER 4 TIMES DAILY FOR SHORTNESS OF BREATH J44.9 08/07/15  Yes Eulas Post, MD  aspirin 81 MG tablet Take 81 mg by mouth daily.     Yes Historical Provider, MD  budesonide-formoterol (SYMBICORT) 80-4.5 MCG/ACT inhaler Inhale 2 puffs into the lungs 2 (two) times daily. 07/17/15  Yes Ripudeep K Rai, MD  donepezil (ARICEPT) 10 MG tablet TAKE 1 TABLET BY MOUTH DAILY 05/22/15  Yes Eulas Post, MD  ferrous sulfate 325 (65 FE) MG tablet Take 325 mg by mouth daily with breakfast.    Yes Historical Provider, MD  furosemide (LASIX) 20 MG tablet Take 1 tablet (20 mg total) by mouth daily. 07/17/15  Yes Ripudeep Krystal Eaton, MD  Multiple Vitamin (MULTIVITAMIN) tablet Take 1 tablet by mouth daily.    Yes Historical Provider, MD  omeprazole (PRILOSEC) 20 MG capsule TAKE 1 CAPSULE BY MOUTH EVERY DAY 08/04/15  Yes Eulas Post, MD  sertraline (ZOLOFT) 100 MG tablet TAKE 1 TABLET  BY MOUTH DAILY 08/04/15  Yes Eulas Post, MD  SPIRIVA HANDIHALER 18 MCG inhalation capsule PLACE 1 CAPSULE INTO INHALER AND INHALE ONCE DAILY 05/22/15  Yes Eulas Post, MD  azithromycin (ZITHROMAX) 250 MG tablet Take 1 tablet (250 mg total) by mouth daily. 08/13/15   Quintella Reichert, MD  cefUROXime (CEFTIN) 500 MG tablet Take 1 tablet (500 mg total) by mouth 2 (two) times daily with a meal. 08/13/15   Quintella Reichert, MD  predniSONE (DELTASONE) 10 MG tablet Take 4 tablets (40 mg total) by mouth daily. 08/13/15   Quintella Reichert, MD    Physical Exam: Filed Vitals:   08/13/15 1515 08/13/15 1745 08/13/15 1830 08/13/15 1845  BP: 119/68 129/72 126/70 115/57  Pulse: 67 81 102 87  Temp:      TempSrc:      Resp: 18 22 23 22   SpO2: 95% 100% 97% 95%    Alert disoriented can only tell me that he is at [facility]. Cannot tell me date, time, year, city External ocular movements intact Chest clinically clear bilaterally no TVR no TVF No JVD S1 S2 no murmur rub or gallop Abdomen soft nontender no rebound no guarding No lower extremity edema Able to move all 4 limbs equally Edentulous and Mallampati stage III  Labs on Admission:  Basic Metabolic Panel:  Recent Labs Lab 08/13/15 1542  NA 142  K 4.2  CL 109  CO2 21*  GLUCOSE 97  BUN 13  CREATININE 1.28*  CALCIUM 8.9   Liver Function Tests: No results for input(s): AST, ALT, ALKPHOS, BILITOT, PROT, ALBUMIN in the last 168 hours. No results for input(s): LIPASE, AMYLASE in the last 168 hours. No results for input(s): AMMONIA in the last 168 hours. CBC:  Recent Labs Lab 08/13/15 1542  WBC 6.4  NEUTROABS 4.4  HGB 15.8  HCT 49.1  MCV 91.4  PLT 137*   Cardiac Enzymes: No results for input(s): CKTOTAL, CKMB, CKMBINDEX, TROPONINI in the last 168 hours.  BNP (last 3 results)  Recent Labs  07/12/15 2013  BNP 172.9*    ProBNP (last 3 results) No results for input(s): PROBNP in the last 8760 hours.  CBG: No results for input(s): GLUCAP in the last 168 hours.  Radiological Exams on Admission: Dg Chest 2 View  08/13/2015  CLINICAL DATA:  Worsening exertional dyspnea today. Recent pneumonia. EXAM: CHEST  2 VIEW COMPARISON:  05/11/2016 FINDINGS: Cardiomegaly and chronic pulmonary venous hypertension remains stable in appearance. Opacity is seen in the left retrocardiac lung base, which appears increased since previous study. This could be due to pulmonary airspace disease or underpenetration. No definite evidence of pleural effusion. Prior CABG again  noted. IMPRESSION: Increased opacity in the left retrocardiac lung base, which could be due to pneumonia or underpenetration. Recommend clinical correlation and short-term radiographic followup. Stable cardiomegaly and chronic pulmonary venous hypertension. Electronically Signed   By: Earle Gell M.D.   On: 08/13/2015 16:07    EKG: Independently reviewed. PR interval 0.20 QRS axis left anterior fascicular block -29 no ST-T wave changes no abnormality  ?Aspiration PNA PH 7.35/43.1 Long discussion with family members at the bedside and they have elected to trial hospice as they realize that his0 dyspnea has worsened. I had a detailed discussion at the bedside explaining the difference in trajectories with regards to end-of-life care and explained that I would simplify multiple medications. We would not for example treat iron deficiency, give prophylactic aspirin, treat hypertension aggressively, but can continue meds  for depression and dementia for now  I will admitted to [place]. We will have him have comfort feeds.   I mentioned that we could also have oral morphine for comfort with breathing and air hunger  After discussion with family their okay treating this episode of likely aspiration pneumonia with antibiotics orally. I have explained to them that we will ask case management to come in and off for choice of hospice and hospice can then delineate what can be done at home to help the family and patient make this transition as patient would not want her to be at freestanding facility and patient also is not really eligible in my opinion for freestanding hospice as he appears to well for the same   Patient is DO NOT RESUSCITATE and I discussed with both sons  > 30 minutes face-to-face time, one hour total spent in discussion Lilly, [facility] Pager [phone]  If 7PM-7AM, please contact night-coverage [email or web address] Password [facility] 08/13/2015, 6:56  PM

## 2015-08-13 NOTE — Telephone Encounter (Signed)
Nurse requested call back at [phone].

## 2015-08-13 NOTE — Care Management Note (Signed)
Case Management Note  Patient Details  Name: JAVED JUHASZ MRN: [id number] Date of Birth: 06-May-1925  Subjective/Objective:    Patient presents to Ed with shortness of breath wearing oxygen at home.                Action/Plan: [facility] consulted for arrangement of home hospice.   Expected Discharge Date:                  Expected Discharge Plan:  Home w Hospice Care  In-House Referral:     Discharge planning Services  CM Consult  Post Acute Care Choice:  Hospice Choice offered to:  Adult Children  DME Arranged:   (none needed per son) DME Agency:     HH Arranged:  RN [place] Agency:  [facility]  Status of Service:  Completed, signed off  Medicare Important Message Given:    Date Medicare IM Given:    Medicare IM give by:    Date Additional Medicare IM Given:    Additional Medicare Important Message give by:     If discussed at [place] of Stay Meetings, dates discussed:    Additional CommentsLivia Snellen, RN 08/13/2015, 5:50 PM

## 2015-08-14 ENCOUNTER — Telehealth: Payer: Self-pay | Admitting: Family Medicine

## 2015-08-14 ENCOUNTER — Encounter (HOSPITAL_COMMUNITY): Payer: Self-pay | Admitting: *Deleted

## 2015-08-14 DIAGNOSIS — T17998S Other foreign object in respiratory tract, part unspecified causing other injury, sequela: Secondary | ICD-10-CM

## 2015-08-14 MED ORDER — TIOTROPIUM BROMIDE MONOHYDRATE 18 MCG IN CAPS: 18.0000 ug | ORAL_CAPSULE | Freq: Every day | RESPIRATORY_TRACT | Status: AC

## 2015-08-14 MED ORDER — AMOXICILLIN-POT CLAVULANATE 600-42.9 MG/5ML PO SUSR: 800.0000 mg | Freq: Two times a day (BID) | ORAL | Status: AC

## 2015-08-14 MED ORDER — ASPIRIN 81 MG PO CHEW
CHEWABLE_TABLET | ORAL | Status: AC
  Filled 2015-08-14: qty 3

## 2015-08-14 MED ORDER — MORPHINE SULFATE (CONCENTRATE) 10 MG/0.5ML PO SOLN: 5.0000 mg | ORAL | Status: DC | PRN

## 2015-08-14 NOTE — Discharge Summary (Signed)
Physician Discharge Summary  Gerald Dyer [id number] DOB: Mar 29, 1925 DOA: 08/13/2015  PCP: Eulas Post, MD  Admit date: 08/13/2015 Discharge date: 08/14/2015  Time spent: 45 minutes  Recommendations for Outpatient Follow-up:  1. Complete Augmentin on 08/20/15 2. Choice of hospice offered an hospice will follow patient at home and offered support 3. Multiple medications delineated and discontinued as per hospice philosophy and hospice physician to make adjustments as he sees fit  Discharge Diagnoses:  Active Problems:   COPD exacerbation ([place])   Aspiration into airway   Discharge Condition: Guarded  Diet recommendation: Heart healthy  There were no vitals filed for this visit.  History of present illness:  81 y/o ? Known COPD Recent admission 1/19-1/24/17 Acute hypoxic R failure and? 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 I had a detailed discussion over 35 minutes with both sons one of whom is in nurse at [employer] and understands the philosophy. They both have discussed him on some cells and in fact requested specifically hospice care Hospice choice will be given and patient will be discharged home with comfort measures inclusive of morphine and a limited prescription of Augmentin to see if he will clear this infection.   DO NOT RESUSCITATE form was signed   Discharge Exam: Filed Vitals:   08/13/15 1921 08/13/15 1955  BP:  138/86 137/69  Pulse: 87 85  Temp:  97.9 F (36.6 C)  Resp: 16 22    General: alert, thinks he is at home.  No overt complaint Cardiovascular: s1 s 2no m/r/g Respiratory: clear no added sounds  Discharge Instructions   Discharge Instructions    Diet - low sodium heart healthy    Complete by:  As directed      Discharge instructions    Complete by:  As directed   Multiple medications have been simplified We will discharge patient on medications compatible with hospice philosophy Hospice choice will be offered and they will follow-up at home and inform you of what they are able to help provide in the outpatient setting. Complete 6 more days of Augmentin     Face-to-face encounter (required for Medicare/Medicaid patients)    Complete by:  As directed   I Quintella Reichert certify that this patient is under my care and that I had a face-to-face encounter that meets the physician face-to-face encounter requirements with this patient on 08/13/2015. The encounter with the patient was in whole, or in part for the following medical condition(s) which is the primary reason for home health care (List medical condition):  Pt here with confusion and shortness of breath due to COPD exacerbation.  Further orders per PCP  The encounter with the patient was in whole, or in part, for the following medical condition, which is the primary reason for home health care:  COPD exacerbation  I certify that, based on my findings, the following services are medically necessary home health services:  Nursing  Reason for Medically [place]  Services:  Skilled Nursing- Change/Decline in Patient Status  My clinical findings support the need for the above services:  Shortness of breath with activity  Further, I certify that my clinical findings support that this patient is homebound due to:  Shortness of Breath with activity     Home Health    Complete by:  As directed   Referral for home hospice  To  provide the following care/treatments:  RN     Increase activity slowly    Complete by:  As directed           Current Discharge Medication List    START taking these medications   Details  amoxicillin-clavulanate (AUGMENTIN) 600-42.9 MG/5ML suspension Take 6.7 mLs (800 mg total) by mouth every 12 (twelve) hours. Qty: 200 mL, Refills: 0    Morphine Sulfate (MORPHINE CONCENTRATE) 10 MG/0.5ML SOLN concentrated solution Take 0.25 mLs (5 mg total) by mouth every 2 (two) hours as needed for moderate pain (or dyspnea). Qty: 42 mL, Refills: 0      CONTINUE these medications which have CHANGED   Details  cefUROXime (CEFTIN) 500 MG tablet Take 1 tablet (500 mg total) by mouth 2 (two) times daily with a meal. Qty: 12 tablet, Refills: 0    predniSONE (DELTASONE) 10 MG tablet Take 4 tablets (40 mg total) by mouth daily. Qty: 16 tablet, Refills: 0    tiotropium (SPIRIVA HANDIHALER) 18 MCG inhalation capsule Place 1 capsule (18 mcg total) into inhaler and inhale daily. Qty: 30 capsule, Refills: 12      CONTINUE these medications which have NOT CHANGED   Details  albuterol (PROVENTIL) (2.5 MG/3ML) 0.083% nebulizer solution USE 1 VIAL IN NEBULIZER 4 TIMES DAILY FOR SHORTNESS OF BREATH J44.9 Qty: 75 mL, Refills: 3    budesonide-formoterol (SYMBICORT) 80-4.5 MCG/ACT inhaler Inhale 2 puffs into the lungs 2 (two) times daily. Qty: 1 Inhaler, Refills: 12    donepezil (ARICEPT) 10 MG tablet TAKE 1 TABLET BY MOUTH DAILY Qty: 30 tablet, Refills: 11    furosemide (LASIX) 20 MG tablet Take 1 tablet (20 mg total) by mouth daily. Qty: 30 tablet, Refills: 3    sertraline (ZOLOFT) 100 MG tablet TAKE 1 TABLET BY MOUTH DAILY Qty: 30 tablet, Refills: 5      STOP taking these medications     aspirin 81 MG tablet      ferrous sulfate 325 (65 FE) MG tablet      Multiple Vitamin (MULTIVITAMIN) tablet      omeprazole (PRILOSEC) 20 MG capsule        Allergies  Allergen Reactions  . Codeine  Sulfate Other (See Comments)    Unknown   Follow-up Information    Schedule an appointment as soon as possible for a visit with Eulas Post, MD.   Specialty:  Family Medicine   Contact information:   [place] [address] [phone]        The results of significant diagnostics from this hospitalization (including imaging, microbiology, ancillary and laboratory) are listed below for reference.    Significant Diagnostic Studies: Dg Chest 2 View  08/13/2015  CLINICAL DATA:  Worsening exertional dyspnea today. Recent pneumonia. EXAM: CHEST  2 VIEW COMPARISON:  05/11/2016 FINDINGS: Cardiomegaly and chronic pulmonary venous hypertension remains stable in appearance. Opacity is seen in the left retrocardiac lung base, which appears increased since previous study. This could be due to pulmonary airspace disease or underpenetration. No definite evidence of pleural effusion. Prior CABG again noted.  IMPRESSION: Increased opacity in the left retrocardiac lung base, which could be due to pneumonia or underpenetration. Recommend clinical correlation and short-term radiographic followup. Stable cardiomegaly and chronic pulmonary venous hypertension. Electronically Signed   By: Earle Gell M.D.   On: 08/13/2015 16:07    Microbiology: No results found for this or any previous visit (from the past 240 hour(s)).   Labs: Basic Metabolic Panel:  Recent Labs Lab 08/13/15 1542  NA 142  K 4.2  CL 109  CO2 21*  GLUCOSE 97  BUN 13  CREATININE 1.28*  CALCIUM 8.9   Liver Function Tests: No results for input(s): AST, ALT, ALKPHOS, BILITOT, PROT, ALBUMIN in the last 168 hours. No results for input(s): LIPASE, AMYLASE in the last 168 hours. No results for input(s): AMMONIA in the last 168 hours. CBC:  Recent Labs Lab 08/13/15 1542  WBC 6.4  NEUTROABS 4.4  HGB 15.8  HCT 49.1  MCV 91.4  PLT 137*   Cardiac Enzymes: No results for input(s): CKTOTAL, CKMB,  CKMBINDEX, TROPONINI in the last 168 hours. BNP: BNP (last 3 results)  Recent Labs  07/12/15 2013  BNP 172.9*    ProBNP (last 3 results) No results for input(s): PROBNP in the last 8760 hours.  CBG: No results for input(s): GLUCAP in the last 168 hours.     SignedNita Sells MD   [facility] 08/14/2015, 9:54 AM

## 2015-08-14 NOTE — Telephone Encounter (Signed)
[name] from [facility] [phone]) needs a verbal to see if Dr. Elease Hashimoto would be an attending for this patient and would you like for them to do Symptom management.

## 2015-08-14 NOTE — Progress Notes (Signed)
[facility]  Received request from [facility] for family interest in [facility] for home with hospice. Met with patient and two sons and bedside to explain services and confirm interest. Family understands that [facility] provides visits and not round the clock care. They declined DME needs at this time. Per RNCM they are active with [facility] for home oxygen. Chart reviewed by [place] Director and eligibility confirmed. Family agreeable to RN visit tomorrow at 10:30. Family aware RN will review medications at that time and assess for further DME needs. Son Alvester Chou has [facility] contact information and has been advised to call [phone] this evening with questions.   Thank you.  Erling Conte, [place]

## 2015-08-14 NOTE — Telephone Encounter (Signed)
Yes, I will be happy to and if they can do symptom management that would be great.

## 2015-08-14 NOTE — Care Management Note (Signed)
Case Management Note  Patient Details  Name: Gerald Dyer MRN: [id number] Date of Birth: Apr 28, 1925  Subjective/Objective:        CM following for progression and d/c planning.            Action/Plan: 08/14/2015 Met with family re [place] needs, this pt is currently active with [facility], notes reflect referral to [facility] and [place] ( [facility]). Pt son waiting to talk with [facility], this pt has not at this time actually been  Accepted by [facility] nor has the family made a final decision re switch to hospice vs HH. Currenly awaiting [facility] to meet with son and pt, call placed to [facility] to confirm referral and attempt to confirm time hospice RN will see the pt. This pt has been d/c and will d/c to home once the pt and family meet with hospice rep. Pt is on oxygen at home and has no tank so if pt d/c to home via car this CM will ask to borrow a tank from [facility] as they will be the DME provider is pt changes HH to [facility].   Expected Discharge Date:     08/14/2015             Expected Discharge Plan:  Home w Hospice Care  In-House Referral:     Discharge planning Services  CM Consult  Post Acute Care Choice:  Hospice Choice offered to:  Adult Children  DME Arranged:   (none needed per son)To be discussed with Hospice.  DME Agency:     HH Arranged:  RN [place] Agency:  [facility]  Status of Service:  Completed, signed off  Medicare Important Message Given:    Date Medicare IM Given:    Medicare IM give by:    Date Additional Medicare IM Given:    Additional Medicare Important Message give by:     If discussed at [place] of Stay Meetings, dates discussed:    Additional Comments:  Adron Bene, RN 08/14/2015, 11:23 AM

## 2015-08-14 NOTE — Telephone Encounter (Signed)
They are aware of Dr. Anastasio Auerbach annotations.

## 2015-08-15 ENCOUNTER — Telehealth: Payer: Self-pay | Admitting: Family Medicine

## 2015-08-15 NOTE — Telephone Encounter (Signed)
Okay 

## 2015-08-15 NOTE — Telephone Encounter (Signed)
OK 

## 2015-08-15 NOTE — Telephone Encounter (Signed)
Admission nurse wants an order for Miralax 17 grams daily.  [facility] in [place]

## 2015-08-16 ENCOUNTER — Other Ambulatory Visit: Payer: Self-pay | Admitting: Family Medicine

## 2015-08-16 MED ORDER — POLYETHYLENE GLYCOL 3350 17 GM/SCOOP PO POWD: 17.0000 g | Freq: Every day | ORAL | Status: AC

## 2015-08-16 NOTE — Telephone Encounter (Signed)
Mendel Ryder from [place] [phone]) called needing predniSONE (DELTASONE) 10 MG tablet sent to  [facility] - [place], [place] - [address] [place] [address] [place] [address] [place] [address] [phone] (Phone) [phone] (Fax)       The patient was at [facility] and is to continue taking this medicine but the pharmacy hasn't received this Rx.

## 2015-08-16 NOTE — Telephone Encounter (Signed)
Medication sent in for patient. 

## 2015-08-16 NOTE — Telephone Encounter (Signed)
Pts D/C summary states that pt should be taking Prednisone 10mg  qid w/o a stop date. Should pt continue on this medication?

## 2015-08-16 NOTE — Telephone Encounter (Signed)
This is not clear at all from the [facility] Discharge.  Normally we would taper this over several days but d/c instructions did not state this.  I would taper prednisone 10 mg over 9 days as follows: 4-4-4-3-3-2-2-1-1

## 2015-08-17 MED ORDER — PREDNISONE 10 MG PO TABS: ORAL_TABLET | ORAL | Status: AC

## 2015-08-17 NOTE — Telephone Encounter (Signed)
Spoke with Mendel Ryder and she is aware that RX was sent in as directed.

## 2015-08-22 ENCOUNTER — Telehealth: Payer: Self-pay | Admitting: Family Medicine

## 2015-08-22 ENCOUNTER — Ambulatory Visit: Payer: Medicare Other | Admitting: Family Medicine

## 2015-08-22 NOTE — Telephone Encounter (Signed)
Kelly from [employer] need ICD 10 code for medication albuterol.  Phone # [phone]

## 2015-08-23 NOTE — Telephone Encounter (Signed)
Gerald Dyer is aware of the [place].1.

## 2015-09-06 ENCOUNTER — Telehealth: Payer: Self-pay | Admitting: Family Medicine

## 2015-09-06 NOTE — Telephone Encounter (Signed)
Zigmund Daniel a hospice nurse call to ask for a anxiety medicine. She said pt is having restless and anxiety starting to talk to dead relatives. Ativan OR LORAZAPAM as needed   Hospice phone number [phone]  Any nurse manager can take the order       Pharmacy [facility]

## 2015-09-07 MED ORDER — LORAZEPAM 0.5 MG PO TABS: ORAL_TABLET | ORAL | Status: DC

## 2015-09-07 NOTE — Telephone Encounter (Signed)
Lorazepam 0.5 mg po a 8 hours prn anxiety.

## 2015-09-07 NOTE — Telephone Encounter (Signed)
Medication called into the patients pharmacy. Home Health is aware and will contact pt's family to advise.

## 2015-09-10 ENCOUNTER — Telehealth: Payer: Self-pay | Admitting: Family Medicine

## 2015-09-10 NOTE — Telephone Encounter (Signed)
Patient has had increased anxiety, confusion and restlessness.  He will be transferred to [facility] on tomorrow per the family's request.  Nurse is requesting order to increase lorazepam from .5mg  every 8 hours to .5mg  every 4 hours as this seems to help patient more.  She is also requesting refill of morphine 20 mg liquid .25-.5 every 2 hours as needed sent to [facility] in [place].

## 2015-09-10 NOTE — Telephone Encounter (Signed)
OK to send in rx as requested.

## 2015-09-11 ENCOUNTER — Telehealth: Payer: Self-pay | Admitting: Family Medicine

## 2015-09-11 MED ORDER — MORPHINE SULFATE 20 MG/5ML PO SOLN: ORAL | Status: AC

## 2015-09-11 MED ORDER — LORAZEPAM 0.5 MG PO TABS
ORAL_TABLET | ORAL | Status: AC
Start: 2015-09-11 — End: ?

## 2015-09-11 MED ORDER — MORPHINE SULFATE 20 MG/5ML PO SOLN: ORAL | Status: DC

## 2015-09-11 NOTE — Telephone Encounter (Signed)
Pharm needs correct morphine rx  fax to [phone]. Pt is hospice pt

## 2015-09-11 NOTE — Telephone Encounter (Signed)
I think this was already sent in earlier today.

## 2015-09-11 NOTE — Telephone Encounter (Signed)
Faxed to Pharmacy for Hospice patient.

## 2015-09-22 DEATH — deceased

## 2015-10-01 ENCOUNTER — Other Ambulatory Visit: Payer: Self-pay | Admitting: *Deleted

## 2015-10-01 ENCOUNTER — Encounter: Payer: Self-pay | Admitting: *Deleted

## 2015-10-01 NOTE — Patient Outreach (Signed)
[facility]) Care Management  10/01/2015  HAWTHORNE BARANY 05-24-1925 [id number]   High Risk Referral:  Telephone to patient; son answered call & advised that patient was deceased as of 10-Oct-2015.  He advised that patient passed away in [facility]. Son is aware of available grief counseling if needed.   Date of passing was verified via community resource.   Plan: Will advise care management assistant of the above & to close out case.  MD closure letter sent.   Sherrin Daisy, RN BSN [place] Management Coordinator [facility] Care Management  [phone]

## 2016-06-26 IMAGING — DX DG CHEST 2V
2 series · 2 of 2 positions shown · non-contrast
Comparison: 06/17/2015

CLINICAL DATA: Pt weak and not acting right today per nurse notes.
Pt could not say why he was here, was confused. Hx of COPD, HTN.

EXAM:
CHEST  2 VIEW

[x chest ap]
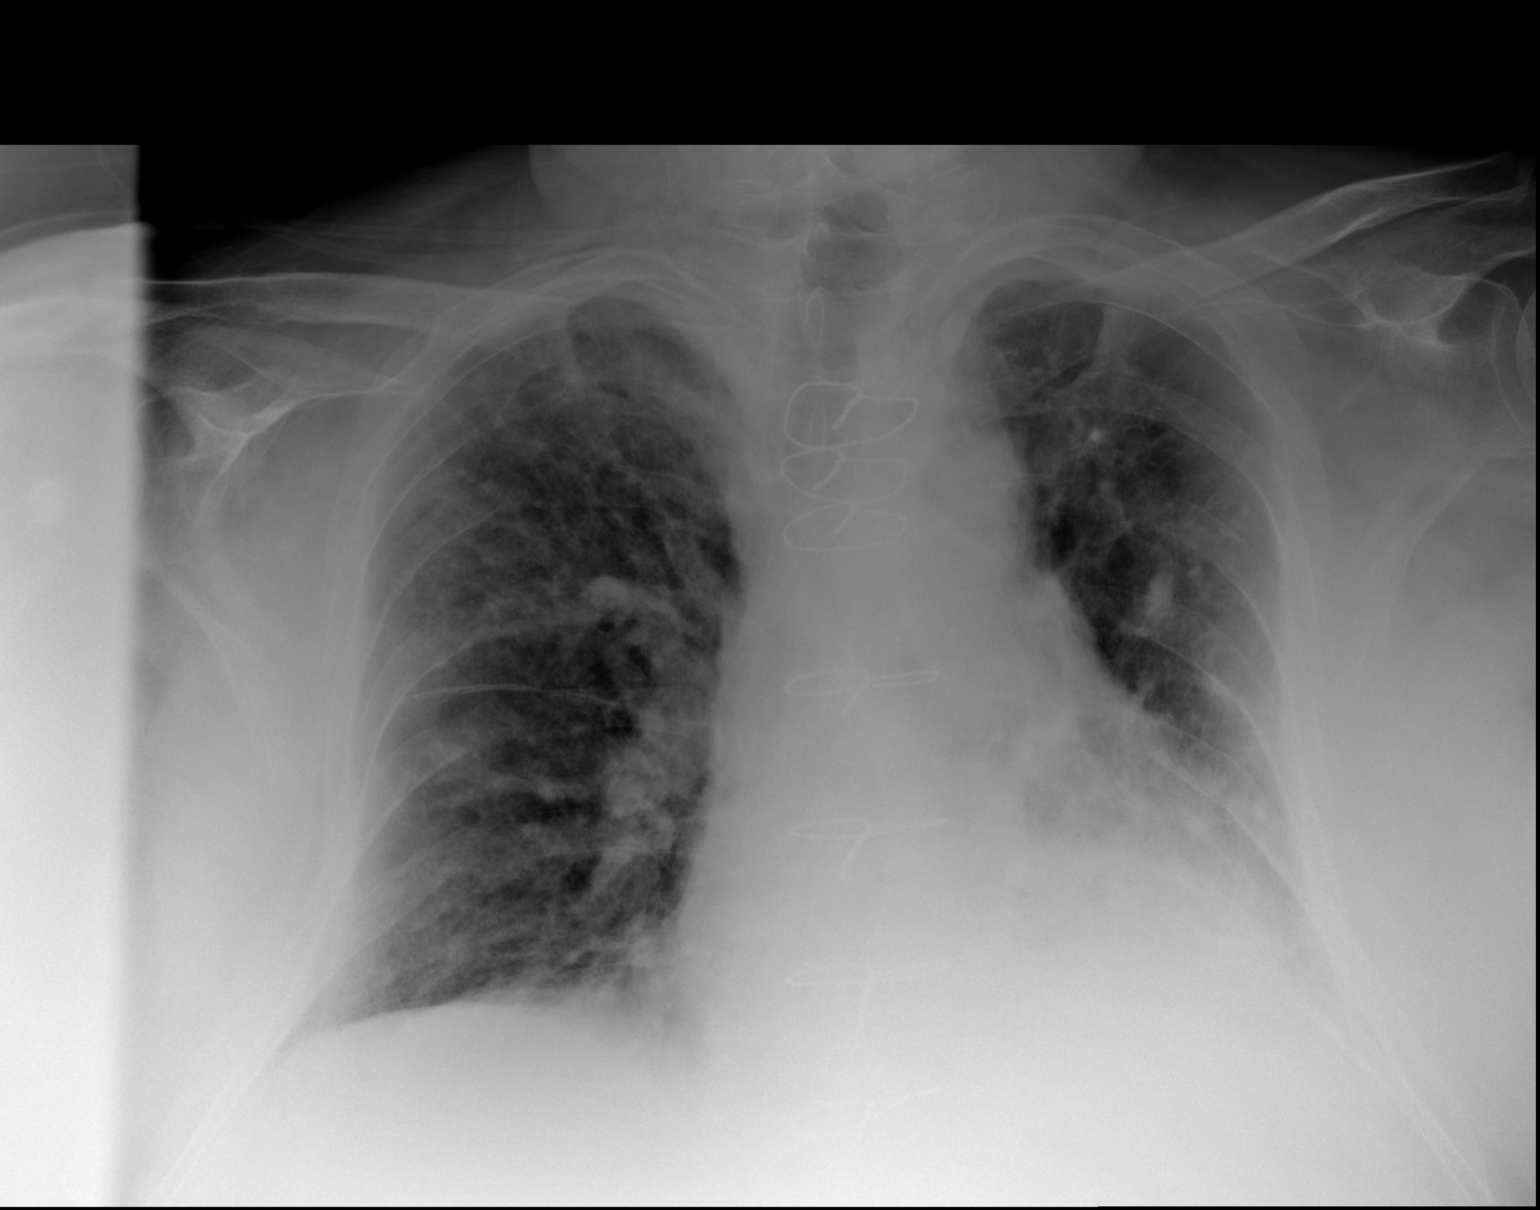

[w chest lat]
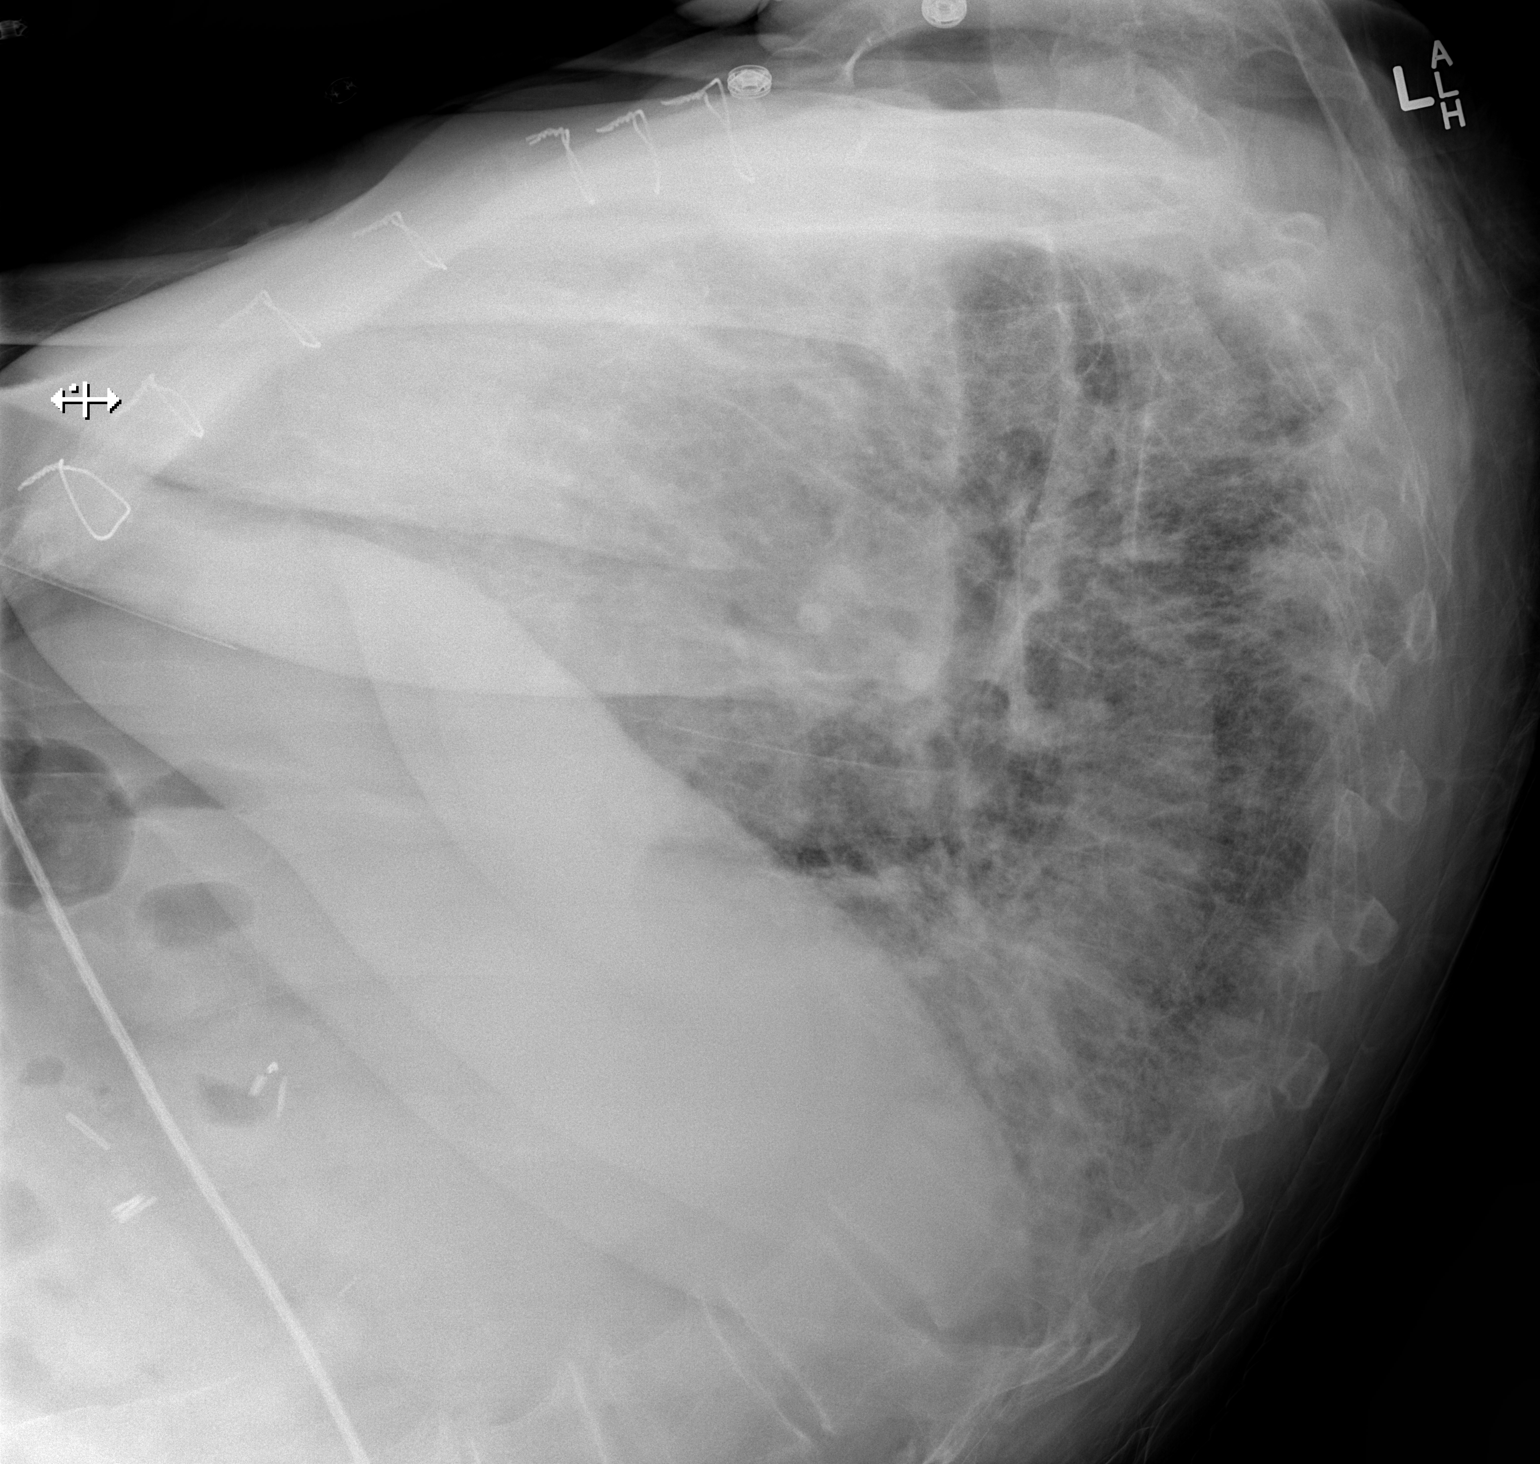

[2 of 2 positions shown; findings below may reference images not displayed]

FINDINGS: Status post median sternotomy and CABG. The heart is enlarged and
stable in configuration. There is prominence of interstitial
markings. Patchy density at the left lung base is partially chronic.
However the left hemidiaphragm is partially obscured posteriorly,
suspicious for confluent acute infiltrate in the posterior left
lower lobe.
IMPRESSION: 1. Cardiomegaly and chronic lung changes.
2. Suspect increased infiltrate in the posterior left lower lobe.
